# Patient Record
Sex: Male | Born: 1935 | Race: White | Hispanic: No | Marital: Married | Smoking: Former smoker
Health system: Southern US, Community
[De-identification: ages and names within clinical notes are randomized; demographics above are authoritative.]

## PROBLEM LIST (undated history)

## (undated) DIAGNOSIS — J45909 Unspecified asthma, uncomplicated: Secondary | ICD-10-CM

## (undated) DIAGNOSIS — I5042 Chronic combined systolic (congestive) and diastolic (congestive) heart failure: Secondary | ICD-10-CM

## (undated) DIAGNOSIS — E785 Hyperlipidemia, unspecified: Secondary | ICD-10-CM

## (undated) DIAGNOSIS — I2510 Atherosclerotic heart disease of native coronary artery without angina pectoris: Secondary | ICD-10-CM

## (undated) DIAGNOSIS — I10 Essential (primary) hypertension: Secondary | ICD-10-CM

## (undated) DIAGNOSIS — J449 Chronic obstructive pulmonary disease, unspecified: Secondary | ICD-10-CM

## (undated) DIAGNOSIS — M1990 Unspecified osteoarthritis, unspecified site: Secondary | ICD-10-CM

## (undated) DIAGNOSIS — I499 Cardiac arrhythmia, unspecified: Secondary | ICD-10-CM

## (undated) DIAGNOSIS — I429 Cardiomyopathy, unspecified: Secondary | ICD-10-CM

## (undated) DIAGNOSIS — D494 Neoplasm of unspecified behavior of bladder: Secondary | ICD-10-CM

## (undated) DIAGNOSIS — N189 Chronic kidney disease, unspecified: Secondary | ICD-10-CM

## (undated) DIAGNOSIS — R0602 Shortness of breath: Secondary | ICD-10-CM

## (undated) DIAGNOSIS — R351 Nocturia: Secondary | ICD-10-CM

---

## 1998-07-16 HISTORY — PX: HAND SURGERY: SHX662

## 2013-08-17 NOTE — Progress Notes (Signed)
Dr. Wynelle Link could you please enter orders in [facility] as patient has pre-op appointment on 08/24/2013 at 8 am! Thank you!

## 2013-08-18 ENCOUNTER — Other Ambulatory Visit: Payer: Self-pay | Admitting: Orthopedic Surgery

## 2013-08-19 ENCOUNTER — Other Ambulatory Visit: Payer: Self-pay | Admitting: Surgical

## 2013-08-19 ENCOUNTER — Encounter (HOSPITAL_COMMUNITY): Payer: Self-pay | Admitting: Pharmacy Technician

## 2013-08-19 NOTE — H&P (Signed)
TOTAL KNEE ADMISSION H&P  Patient is being admitted for left total knee arthroplasty.  Subjective:  Chief Complaint:left knee pain.  HPI: Jerry Chase, 79 y.o. male, has a history of pain and functional disability in the left knee due to arthritis and has failed non-surgical conservative treatments for greater than 12 weeks to includeNSAID's and/or analgesics, corticosteriod injections, viscosupplementation injections and activity modification.  Onset of symptoms was gradual, starting >10 years ago with gradually worsening course since that time. The patient noted no past surgery on the left knee(s).  Patient currently rates pain in the left knee(s) at 7 out of 10 with activity. Patient has night pain, worsening of pain with activity and weight bearing, pain that interferes with activities of daily living, pain with passive range of motion, crepitus and joint swelling.  Patient has evidence of periarticular osteophytes and joint space narrowing by imaging studies.  There is no active infection.  Past Medical History Asthma Shingles Bronchitis COPD HTN Hypercholesteremia  NKDA  Medications Advair Diskus (250-50MCG/DOSE Aero Pow Br Act, Inhalation) Active. Benzonatate (100MG  Capsule, Oral) Active. Combivent (18-103MCG/ACT Aerosol, Inhalation) Active. Pravastatin Sodium (80MG  Tablet, Oral) Active. Valsartan-Hydrochlorothiazide (160-25MG  Tablet, Oral) Active. Verapamil HCl ER (240MG  Tablet ER, Oral) Active. tramadol (prn) Active. History  Substance Use Topics  . Smoking status: Smokes 1/2 pack per day for 60 years  . Smokeless tobacco: None  . Alcohol Use: None    Family History Father deceased age 43 due to MVA; history of COPD Mother deceased age 68 due to cancer; history of heart disease  Review of Systems  Constitutional: Negative.   HENT: Negative.   Eyes: Negative.   Respiratory: Positive for cough, shortness of breath and wheezing. Negative for hemoptysis and sputum  production.        SOB on exertion  Cardiovascular: Negative.   Gastrointestinal: Negative.   Genitourinary: Negative.   Musculoskeletal: Positive for back pain and joint pain. Negative for falls, myalgias and neck pain.       Left knee pain  Skin: Negative.   Neurological: Negative.   Endo/Heme/Allergies: Negative.   Psychiatric/Behavioral: Negative.     Objective:  Physical Exam  Constitutional: He is oriented to person, place, and time. He appears well-developed and well-nourished. No distress.  HENT:  Head: Normocephalic and atraumatic.  Right Ear: External ear normal.  Left Ear: External ear normal.  Nose: Nose normal.  Mouth/Throat: Oropharynx is clear and moist.  Eyes: Conjunctivae and EOM are normal.  Neck: Normal range of motion. Neck supple.  Cardiovascular: Normal rate, regular rhythm, normal heart sounds and intact distal pulses.   No murmur heard. Respiratory: Effort normal. No respiratory distress. He has wheezes.  GI: Soft. Bowel sounds are normal. He exhibits no distension. There is no tenderness.  Musculoskeletal:       Right hip: Normal.       Left hip: Normal.       Right knee: Normal.       Left knee: He exhibits decreased range of motion and swelling. He exhibits no effusion and no erythema. Tenderness found. Medial joint line and lateral joint line tenderness noted.       Right lower leg: He exhibits no tenderness and no swelling.       Left lower leg: He exhibits no tenderness and no swelling.  Evaluation of his left knee shows no effusion. He has slight valgus. He has marked crepitus on range of motion. He is tender lateral greater than medial with no instability.  Neurological: He is alert and oriented to person, place, and time. He has normal strength and normal reflexes. No sensory deficit.  Skin: No rash noted. He is not diaphoretic. No erythema.  Psychiatric: His behavior is normal.    Vitals Weight: 195 lb Height: 70 in Body Surface  Area: 2.09 m Body Mass Index: 27.98 kg/m Pulse: 88 (Regular) BP: 132/76 (Sitting, Left Arm, Standard)  Imaging Review Plain radiographs demonstrate severe degenerative joint disease of the left knee(s). The overall alignment ismild valgus. The bone quality appears to be good for age and reported activity level.  Assessment/Plan:  End stage arthritis, left knee   The patient history, physical examination, clinical judgment of the provider and imaging studies are consistent with end stage degenerative joint disease of the left knee(s) and total knee arthroplasty is deemed medically necessary. The treatment options including medical management, injection therapy arthroscopy and arthroplasty were discussed at length. The risks and benefits of total knee arthroplasty were presented and reviewed. The risks due to aseptic loosening, infection, stiffness, patella tracking problems, thromboembolic complications and other imponderables were discussed. The patient acknowledged the explanation, agreed to proceed with the plan and consent was signed. Patient is being admitted for inpatient treatment for surgery, pain control, PT, OT, prophylactic antibiotics, VTE prophylaxis, progressive ambulation and ADL's and discharge planning. The patient is planning to be discharged home with home health services     [place], [place]

## 2013-08-21 ENCOUNTER — Other Ambulatory Visit (HOSPITAL_COMMUNITY): Payer: Self-pay | Admitting: *Deleted

## 2013-08-21 NOTE — Patient Instructions (Addendum)
Jerry Chase  08/21/2013                           YOUR PROCEDURE IS SCHEDULED ON: 08/31/13               PLEASE REPORT TO SHORT STAY CENTER AT : 9:45 AM               CALL THIS NUMBER IF ANY PROBLEMS THE DAY OF SURGERY :               [phone]                                REMEMBER:   Do not eat food or drink liquids AFTER MIDNIGHT   May have clear liquids UNTIL 6 HOURS BEFORE SURGERY (6:45 AM)     Take these medicines the morning of surgery with A SIP OF WATER:  VERAPAMIL / USE INHALERS   Do not wear jewelry, make-up   Do not wear lotions, powders, or perfumes.   Do not shave legs or underarms 12 hrs. before surgery (men may shave face)  Do not bring valuables to the hospital.  Contacts, dentures or bridgework may not be worn into surgery.  Leave suitcase in the car. After surgery it may be brought to your room.  For patients admitted to the hospital more than one night, checkout time is 11:00 AM                                                       The day of discharge.   Patients discharged the day of surgery will not be allowed to drive home.              If going home same day of surgery, must have someone stay with you first              24 hrs at home and arrange for some one to drive you home from hospital.    Special Instructions:   Please read over the following fact sheets that you were given:               1. [place]               2. INCENTIVE SPIROMETER              3. MRSA INFORMATION                                                X_____________________________________________________________________        Failure to follow these instructions may result in cancellation of your surgery

## 2013-08-24 ENCOUNTER — Encounter (HOSPITAL_COMMUNITY)
Admission: RE | Admit: 2013-08-24 | Discharge: 2013-08-24 | Disposition: A | Discharge status: Home / Self Care | Payer: Medicare Other | Source: Ambulatory Visit | Attending: Orthopedic Surgery | Admitting: Orthopedic Surgery

## 2013-08-24 ENCOUNTER — Other Ambulatory Visit: Payer: Self-pay

## 2013-08-24 ENCOUNTER — Encounter (HOSPITAL_COMMUNITY): Payer: Self-pay

## 2013-08-24 ENCOUNTER — Encounter (INDEPENDENT_AMBULATORY_CARE_PROVIDER_SITE_OTHER): Payer: Self-pay

## 2013-08-24 ENCOUNTER — Ambulatory Visit (HOSPITAL_COMMUNITY)
Admission: RE | Admit: 2013-08-24 | Discharge: 2013-08-24 | Disposition: A | Discharge status: Home / Self Care | Payer: Medicare Other | Source: Ambulatory Visit | Attending: Orthopedic Surgery | Admitting: Orthopedic Surgery

## 2013-08-24 DIAGNOSIS — I4891 Unspecified atrial fibrillation: Secondary | ICD-10-CM | POA: Insufficient documentation

## 2013-08-24 DIAGNOSIS — R9431 Abnormal electrocardiogram [ECG] [EKG]: Secondary | ICD-10-CM | POA: Insufficient documentation

## 2013-08-24 DIAGNOSIS — Z01812 Encounter for preprocedural laboratory examination: Secondary | ICD-10-CM | POA: Insufficient documentation

## 2013-08-24 DIAGNOSIS — I4510 Unspecified right bundle-branch block: Secondary | ICD-10-CM | POA: Insufficient documentation

## 2013-08-24 DIAGNOSIS — Z01810 Encounter for preprocedural cardiovascular examination: Secondary | ICD-10-CM | POA: Insufficient documentation

## 2013-08-24 DIAGNOSIS — Z01818 Encounter for other preprocedural examination: Secondary | ICD-10-CM | POA: Insufficient documentation

## 2013-08-24 DIAGNOSIS — I4949 Other premature depolarization: Secondary | ICD-10-CM | POA: Insufficient documentation

## 2013-08-24 HISTORY — DX: Unspecified asthma, uncomplicated: J45.909

## 2013-08-24 HISTORY — DX: Hyperlipidemia, unspecified: E78.5

## 2013-08-24 HISTORY — DX: Essential (primary) hypertension: I10

## 2013-08-24 HISTORY — DX: Unspecified osteoarthritis, unspecified site: M19.90

## 2013-08-24 LAB — COMPREHENSIVE METABOLIC PANEL
ALK PHOS: 74 U/L (ref 39–117)
ALT: 29 U/L (ref 0–53)
AST: 15 U/L (ref 0–37)
Albumin: 3 g/dL — ABNORMAL LOW (ref 3.5–5.2)
BUN: 29 mg/dL — AB (ref 6–23)
CALCIUM: 8.5 mg/dL (ref 8.4–10.5)
CO2: 28 mEq/L (ref 19–32)
Chloride: 102 mEq/L (ref 96–112)
Creatinine, Ser: 1.06 mg/dL (ref 0.50–1.35)
GFR calc Af Amer: 76 mL/min — ABNORMAL LOW (ref >90)
GFR calc non Af Amer: 66 mL/min — ABNORMAL LOW (ref >90)
Glucose, Bld: 101 mg/dL — ABNORMAL HIGH (ref 70–99)
Potassium: 4.5 mEq/L (ref 3.7–5.3)
SODIUM: 140 meq/L (ref 137–147)
TOTAL PROTEIN: 6 g/dL (ref 6.0–8.3)
Total Bilirubin: 0.4 mg/dL (ref 0.3–1.2)

## 2013-08-24 LAB — URINALYSIS, ROUTINE W REFLEX MICROSCOPIC
Bilirubin Urine: NEGATIVE
Glucose, UA: NEGATIVE mg/dL
Hgb urine dipstick: NEGATIVE
KETONES UR: NEGATIVE mg/dL
NITRITE: NEGATIVE
PROTEIN: NEGATIVE mg/dL
Specific Gravity, Urine: 1.021 (ref 1.005–1.030)
Urobilinogen, UA: 0.2 mg/dL (ref 0.0–1.0)
pH: 5.5 (ref 5.0–8.0)

## 2013-08-24 LAB — URINE MICROSCOPIC-ADD ON

## 2013-08-24 LAB — CBC
HEMATOCRIT: 41.2 % (ref 39.0–52.0)
Hemoglobin: 13.7 g/dL (ref 13.0–17.0)
MCH: 30.4 pg (ref 26.0–34.0)
MCHC: 33.3 g/dL (ref 30.0–36.0)
MCV: 91.4 fL (ref 78.0–100.0)
Platelets: 353 10*3/uL (ref 150–400)
RBC: 4.51 MIL/uL (ref 4.22–5.81)
RDW: 13.8 % (ref 11.5–15.5)
WBC: 9.9 10*3/uL (ref 4.0–10.5)

## 2013-08-24 LAB — PROTIME-INR
INR: 1.03 (ref 0.00–1.49)
Prothrombin Time: 13.3 seconds (ref 11.6–15.2)

## 2013-08-24 LAB — APTT: APTT: 26 s (ref 24–37)

## 2013-08-24 LAB — ABO/RH: ABO/RH(D): A POS

## 2013-08-24 LAB — SURGICAL PCR SCREEN
MRSA, PCR: NEGATIVE
Staphylococcus aureus: NEGATIVE

## 2013-08-24 NOTE — Progress Notes (Signed)
EKG reviewed by Dr. Winfred Leeds - recommended pt be seen by cardiologist prior to surgery - Pt informed and Arlee Muslim notified per phone. Office phone out of order - unable to reach [employer] .  UA faxed to Dr. Wynelle Link

## 2013-08-24 NOTE — Progress Notes (Signed)
08/24/13 0818  OBSTRUCTIVE SLEEP APNEA  Have you ever been diagnosed with sleep apnea through a sleep study? No  Do you snore loudly (loud enough to be heard through closed doors)?  1  Do you often feel tired, fatigued, or sleepy during the daytime? 0  Has anyone observed you stop breathing during your sleep? 0  Do you have, or are you being treated for high blood pressure? 1  BMI more than 35 kg/m2? 0  Age over 77 years old? 1  Neck circumference greater than 40 cm/18 inches? 0  Gender: 1  Obstructive Sleep Apnea Score 4  Score 4 or greater  Results sent to PCP

## 2013-08-28 NOTE — Progress Notes (Signed)
Spoke with Jerry Chase concerning cardiac clearance- pt is seeing cardiologist today - awaiting disposition .

## 2013-08-31 ENCOUNTER — Inpatient Hospital Stay (HOSPITAL_COMMUNITY): Admission: RE | Admit: 2013-08-31 | Payer: Medicare Other | Source: Ambulatory Visit | Admitting: Orthopedic Surgery

## 2013-08-31 ENCOUNTER — Encounter (HOSPITAL_COMMUNITY): Admission: RE | Payer: Self-pay | Source: Ambulatory Visit

## 2013-08-31 LAB — TYPE AND SCREEN
ABO/RH(D): A POS
ANTIBODY SCREEN: NEGATIVE

## 2013-08-31 SURGERY — ARTHROPLASTY, KNEE, TOTAL
Anesthesia: Choice | Site: Knee | Laterality: Left

## 2013-09-28 MED ORDER — ACETAMINOPHEN 500 MG PO TABS
1000.0000 mg | ORAL_TABLET | Freq: Once | ORAL | Status: DC
  Filled 2013-09-28: qty 2

## 2013-09-28 MED ORDER — CEFAZOLIN SODIUM-DEXTROSE 2-3 GM-% IV SOLR
2.0000 g | INTRAVENOUS | Status: DC
  Filled 2013-09-28: qty 50

## 2013-09-28 MED ORDER — TRANEXAMIC ACID 100 MG/ML IV SOLN
1000.0000 mg | INTRAVENOUS | Status: DC
  Filled 2013-09-28: qty 10

## 2013-09-28 MED ORDER — DEXAMETHASONE SODIUM PHOSPHATE 10 MG/ML IJ SOLN
10.0000 mg | Freq: Once | INTRAMUSCULAR | Status: DC
  Filled 2013-09-28: qty 1

## 2013-09-28 MED ORDER — BUPIVACAINE LIPOSOME 1.3 % IJ SUSP
20.0000 mL | Freq: Once | INTRAMUSCULAR | Status: DC
  Filled 2013-09-28: qty 20

## 2013-09-29 ENCOUNTER — Encounter (HOSPITAL_COMMUNITY): Payer: Self-pay | Admitting: Anesthesiology

## 2013-09-29 ENCOUNTER — Ambulatory Visit (HOSPITAL_COMMUNITY)
Admission: RE | Admit: 2013-09-29 | Discharge: 2013-09-29 | Disposition: A | Discharge status: Home / Self Care | Payer: Medicare Other | Source: Ambulatory Visit | Attending: Cardiology | Admitting: Cardiology

## 2013-09-29 ENCOUNTER — Encounter (HOSPITAL_COMMUNITY)
Admission: RE | Disposition: A | Discharge status: Home / Self Care | Payer: Self-pay | Source: Ambulatory Visit | Attending: Cardiology

## 2013-09-29 ENCOUNTER — Encounter (HOSPITAL_COMMUNITY): Payer: Medicare Other | Admitting: Anesthesiology

## 2013-09-29 ENCOUNTER — Ambulatory Visit (HOSPITAL_COMMUNITY): Payer: Medicare Other | Admitting: Anesthesiology

## 2013-09-29 DIAGNOSIS — J4489 Other specified chronic obstructive pulmonary disease: Secondary | ICD-10-CM | POA: Insufficient documentation

## 2013-09-29 DIAGNOSIS — I10 Essential (primary) hypertension: Secondary | ICD-10-CM | POA: Insufficient documentation

## 2013-09-29 DIAGNOSIS — J449 Chronic obstructive pulmonary disease, unspecified: Secondary | ICD-10-CM | POA: Insufficient documentation

## 2013-09-29 DIAGNOSIS — I4891 Unspecified atrial fibrillation: Secondary | ICD-10-CM | POA: Insufficient documentation

## 2013-09-29 DIAGNOSIS — F17200 Nicotine dependence, unspecified, uncomplicated: Secondary | ICD-10-CM | POA: Insufficient documentation

## 2013-09-29 HISTORY — PX: CARDIOVERSION: SHX1299

## 2013-09-29 LAB — BASIC METABOLIC PANEL
BUN: 24 mg/dL — ABNORMAL HIGH (ref 6–23)
CO2: 25 mEq/L (ref 19–32)
CREATININE: 0.89 mg/dL (ref 0.50–1.35)
Calcium: 9 mg/dL (ref 8.4–10.5)
Chloride: 104 mEq/L (ref 96–112)
GFR calc non Af Amer: 80 mL/min — ABNORMAL LOW (ref >90)
Glucose, Bld: 101 mg/dL — ABNORMAL HIGH (ref 70–99)
Potassium: 4.4 mEq/L (ref 3.7–5.3)
Sodium: 142 mEq/L (ref 137–147)

## 2013-09-29 LAB — PROTIME-INR
INR: 1.74 — AB (ref 0.00–1.49)
PROTHROMBIN TIME: 19.8 s — AB (ref 11.6–15.2)

## 2013-09-29 SURGERY — CARDIOVERSION
Anesthesia: General

## 2013-09-29 MED ORDER — SODIUM CHLORIDE 0.9 % IV SOLN
INTRAVENOUS | Status: DC | PRN
  Administered 2013-09-29: 10:00:00 via INTRAVENOUS

## 2013-09-29 MED ORDER — PROPOFOL 10 MG/ML IV BOLUS
INTRAVENOUS | Status: DC | PRN
  Administered 2013-09-29: 30 mg via INTRAVENOUS
  Administered 2013-09-29: 60 mg via INTRAVENOUS

## 2013-09-29 MED ORDER — LIDOCAINE HCL (CARDIAC) 20 MG/ML IV SOLN
INTRAVENOUS | Status: DC | PRN
  Administered 2013-09-29: 40 mg via INTRAVENOUS

## 2013-09-29 NOTE — Transfer of Care (Signed)
Immediate Anesthesia Transfer of Care Note  Patient: Jerry Chase  Procedure(s) Performed: Procedure(s) with comments: CARDIOVERSION (N/A) - 11:35  Lido 20mg , IV, Propofol 60mg ,IV prior to 11:36 synched cardioversion @ 120 joules from  afib, unsuccessful,   11:37 repeated @ 200 joules resulting in unsustained SR with 1 degree heart block, returned to A fib, 11:38 Propofol 30mg ,IV given ...200 joules repeated without successful conversion to SR,...now in A flutter but HR is improved @ 57 - 62 and perfusion/pulse quality is much improved.  Patient Location: PACU and Endoscopy Unit  Anesthesia Type:General  Level of Consciousness: awake and patient cooperative  Airway & Oxygen Therapy: Patient Spontanous Breathing and Patient connected to nasal cannula oxygen  Post-op Assessment: Report given to PACU RN, Post -op Vital signs reviewed and stable and remains in at fib/flutter with PVCs  Post vital signs: Reviewed and stable  Complications: No apparent anesthesia complications

## 2013-09-29 NOTE — Discharge Instructions (Signed)
Electrical Cardioversion, Care After Refer to this sheet in the next few weeks. These instructions provide you with information on caring for yourself after your procedure. Your health care provider may also give you more specific instructions. Your treatment has been planned according to current medical practices, but problems sometimes occur. Call your health care provider if you have any problems or questions after your procedure. WHAT TO EXPECT AFTER THE PROCEDURE After your procedure, it is typical to have the following sensations:  Some redness on the skin where the shocks were delivered. If this is tender, a sunburn lotion or hydrocortisone cream may help.  Possible return of an abnormal heart rhythm within hours or days after the procedure. HOME CARE INSTRUCTIONS  Only take medicine as directed by your health care provider. Be sure you understand how and when to take your medicine.  Learn how to feel your pulse and check it often.  Limit your activity for 48 hours after the procedure or as directed.  Avoid or minimize caffeine and other stimulants as directed. SEEK MEDICAL CARE IF:  You feel like your heart is beating too fast or your pulse is not regular.  You have any questions about your medicines.  You have bleeding that will not stop. SEEK IMMEDIATE MEDICAL CARE IF:  You are dizzy or feel faint.  It is hard to breathe or you feel short of breath.  There is a change in discomfort in your chest.  Your speech is slurred or you have trouble moving an arm or leg on one side of your body.  You get a serious muscle cramp that does not go away.  Your fingers or toes turn cold or blue. MAKE SURE YOU:   Understand these instructions.   Will watch your condition.   Will get help right away if you are not doing well or get worse. Document Released: 04/22/2013 Document Reviewed: 01/14/2013 [facility] Patient Information 2014 [place], [place].

## 2013-09-29 NOTE — H&P (Signed)
  Please see office visit notes for complete details of HPI.  

## 2013-09-29 NOTE — Anesthesia Postprocedure Evaluation (Signed)
  Anesthesia Post-op Note  Patient: Jerry Chase  Procedure(s) Performed: Procedure(s) with comments: CARDIOVERSION (N/A) - 11:35  Lido 20mg , IV, Propofol 60mg ,IV prior to 11:36 synched cardioversion @ 120 joules from  afib, unsuccessful,   11:38 repeated @ 200 joules resulting in unsustained SR with 1 degree heart block, returned to A fib, 11:38 Propofol 30mg ,IV given ...200 joules repeated without successful conversion to SR,...now in A flutter but HR is improved @ 57 - 62 and perfusion/pulse quality is much improved.  Patient Location: PACU and Endoscopy Unit  Anesthesia Type:General  Level of Consciousness: awake  Airway and Oxygen Therapy: Patient Spontanous Breathing  Post-op Pain: none  Post-op Assessment: Post-op Vital signs reviewed, Patient's Cardiovascular Status Stable, Respiratory Function Stable, Patent Airway, No signs of Nausea or vomiting and Pain level controlled  Post-op Vital Signs: Reviewed and stable  Complications: No apparent anesthesia complications

## 2013-09-29 NOTE — Anesthesia Preprocedure Evaluation (Addendum)
Anesthesia Evaluation  Patient identified by MRN, date of birth, ID band Patient awake    Reviewed: Allergy & Precautions, H&P , NPO status , Patient's Chart, lab work & pertinent test results, reviewed documented beta blocker date and time   Airway Mallampati: II TM Distance: >3 FB Neck ROM: Full    Dental  (+) Upper Dentures, Lower Dentures, Dental Advisory Given   Pulmonary asthma , COPDCurrent Smoker,  + rhonchi         Cardiovascular hypertension, Pt. on medications + dysrhythmias Atrial Fibrillation Rate:Normal     Neuro/Psych    GI/Hepatic   Endo/Other    Renal/GU      Musculoskeletal   Abdominal   Peds  Hematology   Anesthesia Other Findings   Reproductive/Obstetrics                         Anesthesia Physical Anesthesia Plan  ASA: III  Anesthesia Plan: General   Post-op Pain Management:    Induction: Intravenous  Airway Management Planned: Mask  Additional Equipment:   Intra-op Plan:   Post-operative Plan:   Informed Consent: I have reviewed the patients History and Physical, chart, labs and discussed the procedure including the risks, benefits and alternatives for the proposed anesthesia with the patient or authorized representative who has indicated his/her understanding and acceptance.     Plan Discussed with: CRNA and Surgeon  Anesthesia Plan Comments:         Anesthesia Quick Evaluation

## 2013-09-29 NOTE — Interval H&P Note (Signed)
History and Physical Interval Note:  09/29/2013 11:33 AM  Jerry Chase  has presented today for surgery, with the diagnosis of AFIB,CARDIOMYOPATHY  The various methods of treatment have been discussed with the patient and family. After consideration of risks, benefits and other options for treatment, the patient has consented to  Procedure(s): CARDIOVERSION (N/A) as a surgical intervention .  The patient's history has been reviewed, patient examined, no change in status, stable for surgery.  I have reviewed the patient's chart and labs.  Questions were answered to the patient's satisfaction.     Laverda Page

## 2013-09-29 NOTE — CV Procedure (Signed)
Direct current cardioversion:  Indication symptomatic A. Fibrillation.  Procedure: Using 90 mg of IV Propofol and 20 IV Lidocaine (for reducing venous pain) for achieving deep sedation, synchronized direct current cardioversion performed. Patient was delivered with 120, 150 and then 200 Joules of electricity, patient can he converted to sinus rhythm, however reverted back to atrial fibrillation. Overall unsuccessful attempt at maintaining sinus rhythm after cardioversion. Patient tolerated the procedure well. No immediate complication noted.

## 2013-09-29 NOTE — Preoperative (Signed)
Beta Blockers   Reason not to administer Beta Blockers:Not Applicable 

## 2013-09-30 ENCOUNTER — Encounter (HOSPITAL_COMMUNITY): Payer: Self-pay | Admitting: Cardiology

## 2013-11-23 ENCOUNTER — Encounter (HOSPITAL_COMMUNITY): Payer: Self-pay | Admitting: Pharmacy Technician

## 2013-11-24 ENCOUNTER — Encounter (HOSPITAL_COMMUNITY): Payer: Self-pay | Admitting: *Deleted

## 2013-11-24 ENCOUNTER — Ambulatory Visit (HOSPITAL_COMMUNITY): Payer: Medicare Other | Admitting: Anesthesiology

## 2013-11-24 ENCOUNTER — Encounter (HOSPITAL_COMMUNITY): Payer: Medicare Other | Admitting: Anesthesiology

## 2013-11-24 ENCOUNTER — Ambulatory Visit (HOSPITAL_COMMUNITY)
Admission: RE | Admit: 2013-11-24 | Discharge: 2013-11-24 | Disposition: A | Discharge status: Home / Self Care | Payer: Medicare Other | Source: Ambulatory Visit | Attending: Cardiology | Admitting: Cardiology

## 2013-11-24 ENCOUNTER — Encounter (HOSPITAL_COMMUNITY)
Admission: RE | Disposition: A | Discharge status: Home / Self Care | Payer: Self-pay | Source: Ambulatory Visit | Attending: Cardiology

## 2013-11-24 DIAGNOSIS — I10 Essential (primary) hypertension: Secondary | ICD-10-CM | POA: Insufficient documentation

## 2013-11-24 DIAGNOSIS — I440 Atrioventricular block, first degree: Secondary | ICD-10-CM | POA: Insufficient documentation

## 2013-11-24 DIAGNOSIS — I498 Other specified cardiac arrhythmias: Secondary | ICD-10-CM | POA: Insufficient documentation

## 2013-11-24 DIAGNOSIS — J4489 Other specified chronic obstructive pulmonary disease: Secondary | ICD-10-CM | POA: Insufficient documentation

## 2013-11-24 DIAGNOSIS — Z87891 Personal history of nicotine dependence: Secondary | ICD-10-CM | POA: Insufficient documentation

## 2013-11-24 DIAGNOSIS — I4891 Unspecified atrial fibrillation: Secondary | ICD-10-CM | POA: Insufficient documentation

## 2013-11-24 DIAGNOSIS — J449 Chronic obstructive pulmonary disease, unspecified: Secondary | ICD-10-CM | POA: Insufficient documentation

## 2013-11-24 HISTORY — PX: CARDIOVERSION: SHX1299

## 2013-11-24 SURGERY — CARDIOVERSION
Anesthesia: Monitor Anesthesia Care

## 2013-11-24 MED ORDER — PROPOFOL 10 MG/ML IV BOLUS
INTRAVENOUS | Status: DC | PRN
  Administered 2013-11-24: 50 mg via INTRAVENOUS

## 2013-11-24 MED ORDER — LIDOCAINE HCL (CARDIAC) 20 MG/ML IV SOLN
INTRAVENOUS | Status: DC | PRN
  Administered 2013-11-24: 20 mg via INTRAVENOUS

## 2013-11-24 MED ORDER — SODIUM CHLORIDE 0.9 % IV SOLN
INTRAVENOUS | Status: DC | PRN
  Administered 2013-11-24: 12:00:00 via INTRAVENOUS

## 2013-11-24 MED ORDER — METOPROLOL TARTRATE 50 MG PO TABS: 25.0000 mg | ORAL_TABLET | Freq: Two times a day (BID) | ORAL | Status: DC

## 2013-11-24 MED ORDER — SODIUM CHLORIDE 0.9 % IV SOLN
INTRAVENOUS | Status: DC
Start: 2013-11-24 — End: 2013-11-24
  Administered 2013-11-24: 10:00:00 via INTRAVENOUS

## 2013-11-24 NOTE — Interval H&P Note (Signed)
History and Physical Interval Note:  11/24/2013 11:34 AM  Jerry Chase  has presented today for surgery, with the diagnosis of A FIB   The various methods of treatment have been discussed with the patient and family. After consideration of risks, benefits and other options for treatment, the patient has consented to  Procedure(s): CARDIOVERSION (N/A) as a surgical intervention .  The patient's history has been reviewed, patient examined, no change in status, stable for surgery.  I have reviewed the patient's chart and labs.  Questions were answered to the patient's satisfaction.     Laverda Page

## 2013-11-24 NOTE — Progress Notes (Signed)
EKG CRITICAL VALUE     12 lead EKG performed.  Critical value noted.  Clemmie Krill, RN notified.   Vanessa Barbara, CCT 11/24/2013 12:11 PM

## 2013-11-24 NOTE — Discharge Instructions (Signed)
Electrical Cardioversion °Electrical cardioversion is the delivery of a jolt of electricity to change the rhythm of the heart. Sticky patches or metal paddles are placed on the chest to deliver the electricity from a device. This is done to restore a normal rhythm. A rhythm that is too fast or not regular keeps the heart from pumping well. °Electrical cardioversion is done in an emergency if:  °· There is low or no blood pressure as a result of the heart rhythm.   °· Normal rhythm must be restored as fast as possible to protect the brain and heart from further damage.   °· It may save a life. °Cardioversion may be done for heart rhythms that are not immediately life-threatening, such as atrial fibrillation or flutter, in which:  °· The heart is beating too fast or is not regular.   °· Medicine to change the rhythm has not worked.   °· It is safe to wait in order to allow time for preparation. °· Symptoms of the abnormal rhythm are bothersome. °· The risk of stroke and other serious complications can be reduced. °LET YOUR CAREGIVER KNOW ABOUT:  °· All medicines you are taking, including vitamins, herbs, eye drops, creams, and over-the-counter medicines.   °· Previous problems you or members of your family have had with the use of anesthetics.   °· Any blood disorders you have.   °· Previous surgeries you have had.   °· Medical conditions you have. °RISKS AND COMPLICATIONS  °Generally, this is a safe procedure. However, as with any procedure, complications can occur. Possible complications include:  °· Breathing problems related to the anesthetic used. °· Cardiac arrest This risk is rare. °· A blood clot that breaks free and travels to other parts of your body. This could cause a stroke or other problems. The risk of this is lowered by use of blood thinning medicine (anticoagulant) prior to the procedure. °BEFORE THE PROCEDURE  °· You may have tests to detect blood clots in your heart and evaluate heart  function.  °· You may start taking anticoagulants so your blood does not clot as easily.   °· Medicines may be given to help stabilize your heart rate and rhythm. °PROCEDURE °· You will be given medicine through an IV tube to reduce discomfort and make you sleepy (sedative).   °· An electrical shock will be delivered. °AFTER THE PROCEDURE °Your heart rhythm will be watched to make sure it does not change. You may be able to go home within a few hours.  °Document Released: 06/22/2002 Document Revised: 04/22/2013 Document Reviewed: 01/14/2013 °[employer]® Patient Information ©2014 [employer]. ° °

## 2013-11-24 NOTE — CV Procedure (Signed)
Direct current cardioversion:  Indication symptomatic A. Fibrillation.  Procedure: Using 60 mg of IV Propofol and 20 IV Lidocaine (for reducing venous pain) for achieving deep sedation, synchronized direct current cardioversion performed. Patient was delivered with 120 Joules of electricity X 1 with success to SR with 1st degree AV block. Patient tolerated the procedure well. No immediate complication noted.

## 2013-11-24 NOTE — Anesthesia Postprocedure Evaluation (Signed)
  Anesthesia Post-op Note  Patient: Jerry Chase  Procedure(s) Performed: Procedure(s): CARDIOVERSION (N/A)  Patient Location: Endoscopy Unit  Anesthesia Type:General  Level of Consciousness: awake, alert , oriented and patient cooperative  Airway and Oxygen Therapy: Patient Spontanous Breathing and Patient connected to nasal cannula oxygen  Post-op Pain: none  Post-op Assessment: Post-op Vital signs reviewed, Patient's Cardiovascular Status Stable, Respiratory Function Stable, Patent Airway, No signs of Nausea or vomiting and Pain level controlled  Post-op Vital Signs: Reviewed and stable  Last Vitals:  Filed Vitals:   11/24/13 1220  BP: 124/67  Pulse: 59  Temp:   Resp: 22    Complications: No apparent anesthesia complications

## 2013-11-24 NOTE — H&P (Signed)
  Please see office visit notes for complete details of HPI.  

## 2013-11-24 NOTE — Anesthesia Preprocedure Evaluation (Addendum)
Anesthesia Evaluation  Patient identified by MRN, date of birth, ID band Patient awake    Reviewed: Allergy & Precautions, H&P , NPO status , Patient's Chart, lab work & pertinent test results, reviewed documented beta blocker date and time   History of Anesthesia Complications Negative for: history of anesthetic complications  Airway Mallampati: II TM Distance: >3 FB Neck ROM: Full    Dental  (+) Upper Dentures, Lower Dentures   Pulmonary asthma , COPD COPD inhaler, former smoker (recently quit),  breath sounds clear to auscultation        Cardiovascular hypertension, Pt. on medications and Pt. on home beta blockers + dysrhythmias (normal LVF as per Dr. Einar Gip) Atrial Fibrillation Rhythm:Irregular Rate:Bradycardia     Neuro/Psych negative neurological ROS     GI/Hepatic negative GI ROS, Neg liver ROS,   Endo/Other  negative endocrine ROS  Renal/GU negative Renal ROS     Musculoskeletal   Abdominal   Peds  Hematology  (+) Blood dyscrasia (xarelto), ,   Anesthesia Other Findings   Reproductive/Obstetrics                          Anesthesia Physical Anesthesia Plan  ASA: III  Anesthesia Plan: General   Post-op Pain Management:    Induction: Intravenous  Airway Management Planned: Mask  Additional Equipment:   Intra-op Plan:   Post-operative Plan:   Informed Consent: I have reviewed the patients History and Physical, chart, labs and discussed the procedure including the risks, benefits and alternatives for the proposed anesthesia with the patient or authorized representative who has indicated his/her understanding and acceptance.     Plan Discussed with: Surgeon and CRNA  Anesthesia Plan Comments: (Plan routine monitors, GA for cardioversion)        Anesthesia Quick Evaluation

## 2013-11-24 NOTE — Transfer of Care (Signed)
Immediate Anesthesia Transfer of Care Note  Patient: Jerry Chase  Procedure(s) Performed: Procedure(s): CARDIOVERSION (N/A)  Patient Location: PACU  Anesthesia Type:General  Level of Consciousness: awake, alert  and oriented  Airway & Oxygen Therapy: Patient Spontanous Breathing and Patient connected to nasal cannula oxygen  Post-op Assessment: Report given to PACU RN and Post -op Vital signs reviewed and stable  Post vital signs: Reviewed and stable  Complications: No apparent anesthesia complications

## 2013-11-25 ENCOUNTER — Encounter (HOSPITAL_COMMUNITY): Payer: Self-pay | Admitting: Cardiology

## 2013-11-30 NOTE — Progress Notes (Signed)
Arlee Muslim, PA  - Please enter preop orders in Epic for Jerry Chase - surg date 6/1.  Thanks

## 2013-12-02 ENCOUNTER — Encounter (HOSPITAL_COMMUNITY): Payer: Self-pay | Admitting: Pharmacy Technician

## 2013-12-04 NOTE — Progress Notes (Signed)
Arlee Muslim, PA - Please enter preop orders in Epic for Jerry Chase  -  Surgery date 6/1 and he is coming to [facility] on Wed 5/27 for preop.  Thanks.

## 2013-12-07 ENCOUNTER — Other Ambulatory Visit: Payer: Self-pay | Admitting: Orthopedic Surgery

## 2013-12-08 NOTE — Patient Instructions (Signed)
Jerry Chase  12/08/2013   Your procedure is scheduled on:  12/14/2013.  0820am-0910am  Report to [facility].  Follow the Signs to [place] at     0600      am  Call this number if you have problems the morning of surgery: [phone]   Remember:   Do not eat food or drink liquids after midnight.   Take these medicines the morning of surgery with A SIP OF WATER:    Do not wear jewelry,   Do not wear lotions, powders, or perfumes.   . Men may shave face and neck.  Do not bring valuables to the hospital.  Contacts, dentures or bridgework may not be worn into surgery.  Leave suitcase in the car. After surgery it may be brought to your room.  For patients admitted to the hospital, checkout time is 11:00 AM the day of  discharge.     [place] - Preparing for Surgery Before surgery, you can play an important role.  Because skin is not sterile, your skin needs to be as free of germs as possible.  You can reduce the number of germs on your skin by washing with CHG (chlorahexidine gluconate) soap before surgery.  CHG is an antiseptic cleaner which kills germs and bonds with the skin to continue killing germs even after washing. Please DO NOT use if you have an allergy to CHG or antibacterial soaps.  If your skin becomes reddened/irritated stop using the CHG and inform your nurse when you arrive at Short Stay. Do not shave (including legs and underarms) for at least 48 hours prior to the first CHG shower.  You may shave your face/neck. Please follow these instructions carefully:  1.  Shower with CHG Soap the night before surgery and the  morning of Surgery.  2.  If you choose to wash your hair, wash your hair first as usual with your  normal  shampoo.  3.  After you shampoo, rinse your hair and body thoroughly to remove the  shampoo.                           4.  Use CHG as you would any other liquid soap.  You can apply chg directly  to the skin and wash         Gently with a scrungie or clean washcloth.  5.  Apply the CHG Soap to your body ONLY FROM THE NECK DOWN.   Do not use on face/ open                           Wound or open sores. Avoid contact with eyes, ears mouth and genitals (private parts).                       Wash face,  Genitals (private parts) with your normal soap.             6.  Wash thoroughly, paying special attention to the area where your surgery  will be performed.  7.  Thoroughly rinse your body with warm water from the neck down.  8.  DO NOT shower/wash with your normal soap after using and rinsing off  the CHG Soap.                9.  Pat yourself dry with a clean towel.  10.  Wear clean pajamas.            11.  Place clean sheets on your bed the night of your first shower and do not  sleep with pets. Day of Surgery : Do not apply any lotions/deodorants the morning of surgery.  Please wear clean clothes to the hospital/surgery center.  FAILURE TO FOLLOW THESE INSTRUCTIONS MAY RESULT IN THE CANCELLATION OF YOUR SURGERY PATIENT SIGNATURE_________________________________  NURSE SIGNATURE__________________________________  ________________________________________________________________________   Jerry Chase  An incentive spirometer is a tool that can help keep your lungs clear and active. This tool measures how well you are filling your lungs with each breath. Taking long deep breaths may help reverse or decrease the chance of developing breathing (pulmonary) problems (especially infection) following:  A long period of time when you are unable to move or be active. BEFORE THE PROCEDURE   If the spirometer includes an indicator to show your best effort, your nurse or respiratory therapist will set it to a desired goal.  If possible, sit up straight or lean slightly forward. Try not to slouch.  Hold the incentive spirometer in an upright position. INSTRUCTIONS FOR USE  1. Sit on the edge of  your bed if possible, or sit up as far as you can in bed or on a chair. 2. Hold the incentive spirometer in an upright position. 3. Breathe out normally. 4. Place the mouthpiece in your mouth and seal your lips tightly around it. 5. Breathe in slowly and as deeply as possible, raising the piston or the ball toward the top of the column. 6. Hold your breath for 3-5 seconds or for as long as possible. Allow the piston or ball to fall to the bottom of the column. 7. Remove the mouthpiece from your mouth and breathe out normally. 8. Rest for a few seconds and repeat Steps 1 through 7 at least 10 times every 1-2 hours when you are awake. Take your time and take a few normal breaths between deep breaths. 9. The spirometer may include an indicator to show your best effort. Use the indicator as a goal to work toward during each repetition. 10. After each set of 10 deep breaths, practice coughing to be sure your lungs are clear. If you have an incision (the cut made at the time of surgery), support your incision when coughing by placing a pillow or rolled up towels firmly against it. Once you are able to get out of bed, walk around indoors and cough well. You may stop using the incentive spirometer when instructed by your caregiver.  RISKS AND COMPLICATIONS  Take your time so you do not get dizzy or light-headed.  If you are in pain, you may need to take or ask for pain medication before doing incentive spirometry. It is harder to take a deep breath if you are having pain. AFTER USE  Rest and breathe slowly and easily.  It can be helpful to keep track of a log of your progress. Your caregiver can provide you with a simple table to help with this. If you are using the spirometer at home, follow these instructions: [place] IF:   You are having difficultly using the spirometer.  You have trouble using the spirometer as often as instructed.  Your pain medication is not giving enough relief  while using the spirometer.  You develop fever of 100.5 F (38.1 C) or higher. SEEK IMMEDIATE MEDICAL CARE IF:   You cough up bloody  sputum that had not been present before.  You develop fever of 102 F (38.9 C) or greater.  You develop worsening pain at or near the incision site. MAKE SURE YOU:   Understand these instructions.  Will watch your condition.  Will get help right away if you are not doing well or get worse. Document Released: 11/12/2006 Document Revised: 09/24/2011 Document Reviewed: 01/13/2007 ExitCare Patient Information 2014 ExitCare, [place].   ________________________________________________________________________  WHAT IS A BLOOD TRANSFUSION? Blood Transfusion Information  A transfusion is the replacement of blood or some of its parts. Blood is made up of multiple cells which provide different functions.  Red blood cells carry oxygen and are used for blood loss replacement.  White blood cells fight against infection.  Platelets control bleeding.  Plasma helps clot blood.  Other blood products are available for specialized needs, such as hemophilia or other clotting disorders. BEFORE THE TRANSFUSION  Who gives blood for transfusions?   Healthy volunteers who are fully evaluated to make sure their blood is safe. This is blood bank blood. Transfusion therapy is the safest it has ever been in the practice of medicine. Before blood is taken from a donor, a complete history is taken to make sure that person has no history of diseases nor engages in risky social behavior (examples are intravenous drug use or sexual activity with multiple partners). The donor's travel history is screened to minimize risk of transmitting infections, such as malaria. The donated blood is tested for signs of infectious diseases, such as HIV and hepatitis. The blood is then tested to be sure it is compatible with you in order to minimize the chance of a transfusion reaction. If you or  a relative donates blood, this is often done in anticipation of surgery and is not appropriate for emergency situations. It takes many days to process the donated blood. RISKS AND COMPLICATIONS Although transfusion therapy is very safe and saves many lives, the main dangers of transfusion include:   Getting an infectious disease.  Developing a transfusion reaction. This is an allergic reaction to something in the blood you were given. Every precaution is taken to prevent this. The decision to have a blood transfusion has been considered carefully by your caregiver before blood is given. Blood is not given unless the benefits outweigh the risks. AFTER THE TRANSFUSION  Right after receiving a blood transfusion, you will usually feel much better and more energetic. This is especially true if your red blood cells have gotten low (anemic). The transfusion raises the level of the red blood cells which carry oxygen, and this usually causes an energy increase.  The nurse administering the transfusion will monitor you carefully for complications. HOME CARE INSTRUCTIONS  No special instructions are needed after a transfusion. You may find your energy is better. Speak with your caregiver about any limitations on activity for underlying diseases you may have. SEEK MEDICAL CARE IF:   Your condition is not improving after your transfusion.  You develop redness or irritation at the intravenous (IV) site. SEEK IMMEDIATE MEDICAL CARE IF:  Any of the following symptoms occur over the next 12 hours:  Shaking chills.  You have a temperature by mouth above 102 F (38.9 C), not controlled by medicine.  Chest, back, or muscle pain.  People around you feel you are not acting correctly or are confused.  Shortness of breath or difficulty breathing.  Dizziness and fainting.  You get a rash or develop hives.  You have a decrease  in urine output.  Your urine turns a dark color or changes to pink, red, or  brown. Any of the following symptoms occur over the next 10 days:  You have a temperature by mouth above 102 F (38.9 C), not controlled by medicine.  Shortness of breath.  Weakness after normal activity.  The white part of the eye turns yellow (jaundice).  You have a decrease in the amount of urine or are urinating less often.  Your urine turns a dark color or changes to pink, red, or brown. Document Released: 06/29/2000 Document Revised: 09/24/2011 Document Reviewed: 02/16/2008 ExitCare Patient Information 2014 ExitCare, [place].  _______________________________________________________________________   Please read over the following fact sheets that you were given: MRSA Information, coughing and deep breathing exercises, leg exercises

## 2013-12-09 ENCOUNTER — Encounter (HOSPITAL_COMMUNITY): Payer: Self-pay

## 2013-12-09 ENCOUNTER — Encounter (HOSPITAL_COMMUNITY)
Admission: RE | Admit: 2013-12-09 | Discharge: 2013-12-09 | Disposition: A | Discharge status: Home / Self Care | Payer: Medicare Other | Source: Ambulatory Visit | Attending: Orthopedic Surgery | Admitting: Orthopedic Surgery

## 2013-12-09 DIAGNOSIS — Z01810 Encounter for preprocedural cardiovascular examination: Secondary | ICD-10-CM | POA: Insufficient documentation

## 2013-12-09 DIAGNOSIS — Z01812 Encounter for preprocedural laboratory examination: Secondary | ICD-10-CM | POA: Insufficient documentation

## 2013-12-09 HISTORY — DX: Cardiac arrhythmia, unspecified: I49.9

## 2013-12-09 LAB — URINALYSIS, ROUTINE W REFLEX MICROSCOPIC
Bilirubin Urine: NEGATIVE
GLUCOSE, UA: NEGATIVE mg/dL
HGB URINE DIPSTICK: NEGATIVE
Ketones, ur: NEGATIVE mg/dL
Nitrite: NEGATIVE
Protein, ur: NEGATIVE mg/dL
Specific Gravity, Urine: 1.012 (ref 1.005–1.030)
Urobilinogen, UA: 0.2 mg/dL (ref 0.0–1.0)
pH: 5.5 (ref 5.0–8.0)

## 2013-12-09 LAB — URINE MICROSCOPIC-ADD ON

## 2013-12-09 LAB — COMPREHENSIVE METABOLIC PANEL
ALK PHOS: 121 U/L — AB (ref 39–117)
ALT: 44 U/L (ref 0–53)
AST: 28 U/L (ref 0–37)
Albumin: 3.6 g/dL (ref 3.5–5.2)
BUN: 29 mg/dL — ABNORMAL HIGH (ref 6–23)
CO2: 26 meq/L (ref 19–32)
Calcium: 9.4 mg/dL (ref 8.4–10.5)
Chloride: 100 mEq/L (ref 96–112)
Creatinine, Ser: 1.14 mg/dL (ref 0.50–1.35)
GFR calc Af Amer: 70 mL/min — ABNORMAL LOW (ref >90)
GFR, EST NON AFRICAN AMERICAN: 60 mL/min — AB (ref >90)
Glucose, Bld: 102 mg/dL — ABNORMAL HIGH (ref 70–99)
POTASSIUM: 4.1 meq/L (ref 3.7–5.3)
SODIUM: 139 meq/L (ref 137–147)
Total Bilirubin: 0.6 mg/dL (ref 0.3–1.2)
Total Protein: 7.1 g/dL (ref 6.0–8.3)

## 2013-12-09 LAB — CBC
HCT: 42.9 % (ref 39.0–52.0)
HEMOGLOBIN: 14 g/dL (ref 13.0–17.0)
MCH: 29.7 pg (ref 26.0–34.0)
MCHC: 32.6 g/dL (ref 30.0–36.0)
MCV: 91.1 fL (ref 78.0–100.0)
Platelets: 292 10*3/uL (ref 150–400)
RBC: 4.71 MIL/uL (ref 4.22–5.81)
RDW: 13.5 % (ref 11.5–15.5)
WBC: 10.3 10*3/uL (ref 4.0–10.5)

## 2013-12-09 LAB — SURGICAL PCR SCREEN
MRSA, PCR: NEGATIVE
STAPHYLOCOCCUS AUREUS: NEGATIVE

## 2013-12-09 LAB — PROTIME-INR
INR: 1.43 (ref 0.00–1.49)
Prothrombin Time: 17.1 seconds — ABNORMAL HIGH (ref 11.6–15.2)

## 2013-12-09 LAB — APTT: aPTT: 36 seconds (ref 24–37)

## 2013-12-09 NOTE — Progress Notes (Addendum)
CXR 09/03/13 EPIC  08/31/13 ECHO- on chart  Stress 09/04/13 on chart  Clearance on chart  Cardioversion 11/24/13 on chart ( note on chart )  LOV 11/13/13 Dr Einar Gip on chart  EKG 12/03/2013 on chart along with last office visit note- Dr Ganji5/21/15 on chart

## 2013-12-13 ENCOUNTER — Other Ambulatory Visit: Payer: Self-pay | Admitting: Orthopedic Surgery

## 2013-12-13 NOTE — Anesthesia Preprocedure Evaluation (Addendum)
Anesthesia Evaluation  Patient identified by MRN, date of birth, ID band Patient awake    Reviewed: Allergy & Precautions, H&P , NPO status , Patient's Chart, lab work & pertinent test results, reviewed documented beta blocker date and time   Airway Mallampati: II TM Distance: >3 FB Neck ROM: full    Dental  (+) Edentulous Upper, Edentulous Lower, Dental Advisory Given   Pulmonary asthma , former smoker,  Mild asthma breath sounds clear to auscultation  Pulmonary exam normal       Cardiovascular hypertension, Pt. on home beta blockers + dysrhythmias Atrial Fibrillation Rhythm:regular Rate:Normal  2 cardioversions 2015. ECG SB RBBB   Neuro/Psych negative neurological ROS  negative psych ROS   GI/Hepatic negative GI ROS, Neg liver ROS,   Endo/Other  negative endocrine ROS  Renal/GU negative Renal ROS  negative genitourinary   Musculoskeletal   Abdominal   Peds  Hematology negative hematology ROS (+)   Anesthesia Other Findings   Reproductive/Obstetrics negative OB ROS                          Anesthesia Physical Anesthesia Plan  ASA: III  Anesthesia Plan: General   Post-op Pain Management:    Induction: Intravenous  Airway Management Planned: Oral ETT  Additional Equipment:   Intra-op Plan:   Post-operative Plan: Extubation in OR  Informed Consent: I have reviewed the patients History and Physical, chart, labs and discussed the procedure including the risks, benefits and alternatives for the proposed anesthesia with the patient or authorized representative who has indicated his/her understanding and acceptance.   Dental Advisory Given  Plan Discussed with: CRNA and Surgeon  Anesthesia Plan Comments:        Anesthesia Quick Evaluation

## 2013-12-13 NOTE — H&P (Signed)
Woodward Ku DOB: Sep 01, 1934 Married / Language: Cleophus Molt / Race: White Male  Date of Admission:  12-14-2013  Chief Complaint:  Left Knee Pain  History of Present Illness The patient is a 79 year old male who comes in  for a preoperative History and Physical. The patient is scheduled for a left total knee arthroplasty to be performed by Dr. Dione Plover. Aluisio, MD at [facility] on 12-14-2013. The patient is a 79 year old male who presents today for follow up of their knee. The patient is being followed for their left knee pain and osteoarthritis. Symptoms reported today include: pain. The patient feels that they are doing poorly and report their pain level to be mild (varies in severity). Current treatment includes: pain medications (tramadol). The following medication has been used for pain control: Ultram (tramadol). The patient presents post synvisc series. Note for "Follow-up Knee": Knee still hurts 90% of the time. Hurts all the time when he walks. Difficulty getting comfortable at night. Tramadol does help, needing refill. The patient states that the knee is preventing him from doing things that he desires. He said that the cortisone did help some, but not to the point where he is able to do things. He is at a stage where he feels like the knee is having a very negative effect on his lifestyle. He was originally scheduled for surgery on Feb 16th of this year but was postponed due to an abnormal EKG. He has since been evaluated by Dr. Einar Gip and felt to be stable for surgery but at moderate risk for a CV event. He would like to proceed with knee replacement at this time. They have been treated conservatively in the past for the above stated problem and despite conservative measures, they continue to have progressive pain and severe functional limitations and dysfunction. They have failed non-operative management including home exercise, medications, and injections. It is felt  that they would benefit from undergoing total joint replacement. Risks and benefits of the procedure have been discussed with the patient and they elect to proceed with surgery. There are no active contraindications to surgery such as ongoing infection or rapidly progressive neurological disease.  Allergies No Known Drug Allergies  Problem List/Past Medical Osteoarthritis, Knee (715.96) Hypercholesterolemia Asthma High blood pressure Shingles Bronchitis COPD Hypertension Atrial Fibrillation    Family History Cancer. mother Hypertension. Father, Brother. father and brother    Social History Tobacco use. Current some day smoker. current some days smoker; smoke(d) less than 1/2 pack(s) per day Pain Contract. no Tobacco / smoke exposure. yes outdoors only Drug/Alcohol Rehab (Currently). no Drug/Alcohol Rehab (Previously). no Current work status. retired No alcohol use Children. 1 Marital status. married Number of flights of stairs before winded. greater than 5 Living situation. live with spouse Exercise. Exercises daily; does running / walking Illicit drug use. no Post-Surgical Plans. Home    Medication History Pravastatin Sodium (80MG  Tablet, Oral) Active. Valsartan-Hydrochlorothiazide (160-25MG  Tablet, Oral) Active. Xarelto (20MG  Tablet, Oral) Active. Metoprolol Tartrate (25MG  Tablet, Oral) Active. Vitamin D (50000U Tablet, Oral once a week) Active. Advair Diskus (250-50MCG/DOSE Aero Pow Br Act, Inhalation) Active. Combivent (18-103MCG/ACT Aerosol, Inhalation) Active. Digoxin (250MCG Tablet, Oral) Active. Spironolactone (25MG  Tablet, Oral) Active. Amiodarone HCl (200MG  Tablet, Oral) Active.    Past Surgical History Right Hand Surgery   Review of Systems General:Not Present- Chills, Fever, Night Sweats, Fatigue, Weight Gain, Weight Loss and Memory Loss. Skin:Not Present- Hives, Itching, Rash, Eczema and Lesions. HEENT:Not  Present- Tinnitus, Headache,  Double Vision, Visual Loss, Hearing Loss and Dentures. Respiratory:Present- Shortness of breath with exertion, Cough and Wheezing. Not Present- Shortness of breath at rest, Allergies, Coughing up blood and Chronic Cough. Cardiovascular:Not Present- Chest Pain, Racing/skipping heartbeats, Difficulty Breathing Lying Down, Murmur, Swelling and Palpitations. Gastrointestinal:Not Present- Bloody Stool, Heartburn, Abdominal Pain, Vomiting, Nausea, Constipation, Diarrhea, Difficulty Swallowing, Jaundice and Loss of appetitie. Male Genitourinary:Present- Urinating at Night. Not Present- Urinary frequency, Blood in Urine, Weak urinary stream, Discharge, Flank Pain, Incontinence, Painful Urination, Urgency and Urinary Retention. Musculoskeletal:Present- Joint Pain. Not Present- Muscle Weakness, Muscle Pain, Joint Swelling, Back Pain, Morning Stiffness and Spasms. Neurological:Not Present- Tremor, Dizziness, Blackout spells, Paralysis, Difficulty with balance and Weakness. Psychiatric:Not Present- Insomnia.    Vitals Pulse: 52 (Regular) Resp.: 12 (Unlabored) BP: 132/60 (Sitting, Right Arm, Standard)     Physical Exam The physical exam findings are as follows:   General Mental Status - Alert, cooperative and good historian. General Appearance- pleasant. Not in acute distress. Orientation- Oriented X3. Build & Nutrition- Well nourished and Well developed.   Head and Neck Head- normocephalic, atraumatic . Neck Global Assessment- supple. no bruit auscultated on the right and no bruit auscultated on the left.   Note: upper and lower denture plates  Eye Vision- Wears corrective lenses. Pupil- Bilateral- Regular and Round. Motion- Bilateral- EOMI.   Chest and Lung Exam Auscultation: Breath sounds:- clear at anterior chest wall and - clear at posterior chest wall. Adventitious sounds:- No Adventitious  sounds.   Cardiovascular Auscultation:Rhythm- Regular rate and rhythm. Heart Sounds- S1 WNL and S2 WNL. Murmurs & Other Heart Sounds:Auscultation of the heart reveals - No Murmurs.   Abdomen Palpation/Percussion:Tenderness- Abdomen is non-tender to palpation. Rigidity (guarding)- Abdomen is soft. Auscultation:Auscultation of the abdomen reveals - Bowel sounds normal.   Male Genitourinary Not done, not pertinent to present illness  Musculoskeletal On exam well developed male, alert and oriented in on apparent distress. Evaluation of his left knee shows no effusion. He has slight valgus. He has marked crepitus on range of motion. He is tender lateral greater than medial with no instability. Pulse sensation and motor intact.  RADIOGRAPHS: We reviewed his radiographs from earlier this year. He is bone on bone lateral and patellofemoral.   Assessment & Plan Osteoarthritis, Knee (715.96) Impression: Left Knee  Note: Plan is for a Left Total Knee Replacement by Dr. Wynelle Link.  Plan is to go home.  Cards - Dr. Einar Gip - Patient has been seen preoperatively and felt to be stable for surgery. He was instructed to hold his Xarelto for 48 hours prior to the surgery.  The patient does not have any contraindications and will receive TXA (tranexamic acid) prior to surgery.  Signed electronically by Joelene Millin, III PA-C

## 2013-12-14 ENCOUNTER — Inpatient Hospital Stay (HOSPITAL_COMMUNITY)
Admission: RE | Admit: 2013-12-14 | Discharge: 2013-12-16 | DRG: 470 | Disposition: A | Discharge status: Home Health | Payer: Medicare Other | Source: Ambulatory Visit | Attending: Orthopedic Surgery | Admitting: Orthopedic Surgery

## 2013-12-14 ENCOUNTER — Encounter (HOSPITAL_COMMUNITY)
Admission: RE | Disposition: A | Discharge status: Home Health | Payer: Self-pay | Source: Ambulatory Visit | Attending: Orthopedic Surgery

## 2013-12-14 ENCOUNTER — Encounter (HOSPITAL_COMMUNITY): Payer: Self-pay | Admitting: *Deleted

## 2013-12-14 ENCOUNTER — Encounter (HOSPITAL_COMMUNITY): Payer: Medicare Other | Admitting: Anesthesiology

## 2013-12-14 ENCOUNTER — Inpatient Hospital Stay (HOSPITAL_COMMUNITY): Payer: Medicare Other | Admitting: Anesthesiology

## 2013-12-14 DIAGNOSIS — Z8249 Family history of ischemic heart disease and other diseases of the circulatory system: Secondary | ICD-10-CM

## 2013-12-14 DIAGNOSIS — Z6829 Body mass index (BMI) 29.0-29.9, adult: Secondary | ICD-10-CM

## 2013-12-14 DIAGNOSIS — F17200 Nicotine dependence, unspecified, uncomplicated: Secondary | ICD-10-CM | POA: Diagnosis present

## 2013-12-14 DIAGNOSIS — I10 Essential (primary) hypertension: Secondary | ICD-10-CM | POA: Diagnosis present

## 2013-12-14 DIAGNOSIS — Z79899 Other long term (current) drug therapy: Secondary | ICD-10-CM

## 2013-12-14 DIAGNOSIS — N478 Other disorders of prepuce: Secondary | ICD-10-CM | POA: Diagnosis present

## 2013-12-14 DIAGNOSIS — J45909 Unspecified asthma, uncomplicated: Secondary | ICD-10-CM | POA: Diagnosis present

## 2013-12-14 DIAGNOSIS — M1710 Unilateral primary osteoarthritis, unspecified knee: Secondary | ICD-10-CM | POA: Diagnosis present

## 2013-12-14 DIAGNOSIS — Z96652 Presence of left artificial knee joint: Secondary | ICD-10-CM

## 2013-12-14 DIAGNOSIS — D409 Neoplasm of uncertain behavior of male genital organ, unspecified: Secondary | ICD-10-CM | POA: Diagnosis present

## 2013-12-14 DIAGNOSIS — E785 Hyperlipidemia, unspecified: Secondary | ICD-10-CM | POA: Diagnosis present

## 2013-12-14 DIAGNOSIS — I4891 Unspecified atrial fibrillation: Secondary | ICD-10-CM | POA: Diagnosis present

## 2013-12-14 DIAGNOSIS — Z01812 Encounter for preprocedural laboratory examination: Secondary | ICD-10-CM

## 2013-12-14 DIAGNOSIS — M179 Osteoarthritis of knee, unspecified: Secondary | ICD-10-CM | POA: Diagnosis present

## 2013-12-14 DIAGNOSIS — N471 Phimosis: Secondary | ICD-10-CM | POA: Diagnosis present

## 2013-12-14 HISTORY — PX: TOTAL KNEE ARTHROPLASTY: SHX125

## 2013-12-14 LAB — TYPE AND SCREEN
ABO/RH(D): A POS
Antibody Screen: NEGATIVE

## 2013-12-14 LAB — PROTIME-INR
INR: 0.93 (ref 0.00–1.49)
PROTHROMBIN TIME: 12.3 s (ref 11.6–15.2)

## 2013-12-14 SURGERY — ARTHROPLASTY, KNEE, TOTAL
Anesthesia: General | Site: Knee | Laterality: Left

## 2013-12-14 MED ORDER — GLYCOPYRROLATE 0.2 MG/ML IJ SOLN
INTRAMUSCULAR | Status: AC
  Filled 2013-12-14: qty 3

## 2013-12-14 MED ORDER — ACETAMINOPHEN 650 MG RE SUPP: 650.0000 mg | Freq: Four times a day (QID) | RECTAL | Status: DC | PRN

## 2013-12-14 MED ORDER — ONDANSETRON HCL 4 MG PO TABS: 4.0000 mg | ORAL_TABLET | Freq: Four times a day (QID) | ORAL | Status: DC | PRN

## 2013-12-14 MED ORDER — DIPHENHYDRAMINE HCL 12.5 MG/5ML PO ELIX: 12.5000 mg | ORAL_SOLUTION | ORAL | Status: DC | PRN

## 2013-12-14 MED ORDER — METOCLOPRAMIDE HCL 5 MG/ML IJ SOLN: 5.0000 mg | Freq: Three times a day (TID) | INTRAMUSCULAR | Status: DC | PRN

## 2013-12-14 MED ORDER — BISACODYL 10 MG RE SUPP: 10.0000 mg | Freq: Every day | RECTAL | Status: DC | PRN

## 2013-12-14 MED ORDER — IPRATROPIUM-ALBUTEROL 20-100 MCG/ACT IN AERS
1.0000 | INHALATION_SPRAY | Freq: Four times a day (QID) | RESPIRATORY_TRACT | Status: DC
  Administered 2013-12-14 – 2013-12-15 (×6): 1 via RESPIRATORY_TRACT

## 2013-12-14 MED ORDER — RIVAROXABAN 10 MG PO TABS
10.0000 mg | ORAL_TABLET | Freq: Every day | ORAL | Status: DC
  Administered 2013-12-15 – 2013-12-16 (×2): 10 mg via ORAL
  Filled 2013-12-14 (×4): qty 1

## 2013-12-14 MED ORDER — SPIRONOLACTONE 25 MG PO TABS
25.0000 mg | ORAL_TABLET | Freq: Every morning | ORAL | Status: DC
  Administered 2013-12-15 – 2013-12-16 (×2): 25 mg via ORAL
  Filled 2013-12-14 (×2): qty 1

## 2013-12-14 MED ORDER — IPRATROPIUM-ALBUTEROL 20-100 MCG/ACT IN AERS
1.0000 | INHALATION_SPRAY | Freq: Four times a day (QID) | RESPIRATORY_TRACT | Status: DC
  Filled 2013-12-14: qty 4

## 2013-12-14 MED ORDER — BUPIVACAINE LIPOSOME 1.3 % IJ SUSP
INTRAMUSCULAR | Status: DC | PRN
  Administered 2013-12-14: 20 mL

## 2013-12-14 MED ORDER — NEOSTIGMINE METHYLSULFATE 10 MG/10ML IV SOLN
INTRAVENOUS | Status: AC
  Filled 2013-12-14: qty 1

## 2013-12-14 MED ORDER — POLYETHYLENE GLYCOL 3350 17 G PO PACK: 17.0000 g | PACK | Freq: Every day | ORAL | Status: DC | PRN

## 2013-12-14 MED ORDER — ROCURONIUM BROMIDE 100 MG/10ML IV SOLN
INTRAVENOUS | Status: DC | PRN
  Administered 2013-12-14: 20 mg via INTRAVENOUS

## 2013-12-14 MED ORDER — CEFAZOLIN SODIUM-DEXTROSE 2-3 GM-% IV SOLR
2.0000 g | Freq: Four times a day (QID) | INTRAVENOUS | Status: AC
  Administered 2013-12-14 (×2): 2 g via INTRAVENOUS
  Filled 2013-12-14 (×2): qty 50

## 2013-12-14 MED ORDER — HYDROMORPHONE HCL PF 1 MG/ML IJ SOLN
0.2500 mg | INTRAMUSCULAR | Status: DC | PRN
  Administered 2013-12-14 (×2): 0.5 mg via INTRAVENOUS

## 2013-12-14 MED ORDER — IPRATROPIUM-ALBUTEROL 20-100 MCG/ACT IN AERS: 1.0000 | INHALATION_SPRAY | Freq: Four times a day (QID) | RESPIRATORY_TRACT | Status: DC

## 2013-12-14 MED ORDER — METHOCARBAMOL 500 MG PO TABS
500.0000 mg | ORAL_TABLET | Freq: Four times a day (QID) | ORAL | Status: DC | PRN
  Administered 2013-12-14 – 2013-12-15 (×2): 500 mg via ORAL
  Filled 2013-12-14 (×3): qty 1

## 2013-12-14 MED ORDER — VALSARTAN-HYDROCHLOROTHIAZIDE 160-25 MG PO TABS: 1.0000 | ORAL_TABLET | Freq: Every evening | ORAL | Status: DC

## 2013-12-14 MED ORDER — DEXAMETHASONE SODIUM PHOSPHATE 10 MG/ML IJ SOLN: 10.0000 mg | Freq: Once | INTRAMUSCULAR | Status: DC

## 2013-12-14 MED ORDER — DEXAMETHASONE 6 MG PO TABS
10.0000 mg | ORAL_TABLET | Freq: Every day | ORAL | Status: AC
  Administered 2013-12-15: 10 mg via ORAL
  Filled 2013-12-14: qty 1

## 2013-12-14 MED ORDER — MORPHINE SULFATE 2 MG/ML IJ SOLN
1.0000 mg | INTRAMUSCULAR | Status: DC | PRN
  Administered 2013-12-15: 1 mg via INTRAVENOUS
  Filled 2013-12-14: qty 1

## 2013-12-14 MED ORDER — BUPIVACAINE HCL 0.25 % IJ SOLN
INTRAMUSCULAR | Status: DC | PRN
  Administered 2013-12-14: 20 mL

## 2013-12-14 MED ORDER — DEXAMETHASONE SODIUM PHOSPHATE 10 MG/ML IJ SOLN
INTRAMUSCULAR | Status: AC
  Filled 2013-12-14: qty 1

## 2013-12-14 MED ORDER — SODIUM CHLORIDE 0.9 % IV SOLN: INTRAVENOUS | Status: DC

## 2013-12-14 MED ORDER — CEFAZOLIN SODIUM-DEXTROSE 2-3 GM-% IV SOLR
INTRAVENOUS | Status: AC
  Filled 2013-12-14: qty 50

## 2013-12-14 MED ORDER — ACETAMINOPHEN 500 MG PO TABS
1000.0000 mg | ORAL_TABLET | Freq: Four times a day (QID) | ORAL | Status: AC
  Administered 2013-12-14 – 2013-12-15 (×4): 1000 mg via ORAL
  Filled 2013-12-14 (×4): qty 2

## 2013-12-14 MED ORDER — FLEET ENEMA 7-19 GM/118ML RE ENEM: 1.0000 | ENEMA | Freq: Once | RECTAL | Status: AC | PRN

## 2013-12-14 MED ORDER — TRANEXAMIC ACID 100 MG/ML IV SOLN
1000.0000 mg | INTRAVENOUS | Status: AC
  Administered 2013-12-14: 1000 mg via INTRAVENOUS
  Filled 2013-12-14: qty 10

## 2013-12-14 MED ORDER — DEXAMETHASONE SODIUM PHOSPHATE 10 MG/ML IJ SOLN
10.0000 mg | Freq: Every day | INTRAMUSCULAR | Status: AC
  Filled 2013-12-14: qty 1

## 2013-12-14 MED ORDER — FENTANYL CITRATE 0.05 MG/ML IJ SOLN
INTRAMUSCULAR | Status: DC | PRN
  Administered 2013-12-14 (×2): 25 ug via INTRAVENOUS
  Administered 2013-12-14: 50 ug via INTRAVENOUS
  Administered 2013-12-14: 25 ug via INTRAVENOUS
  Administered 2013-12-14 (×4): 50 ug via INTRAVENOUS
  Administered 2013-12-14: 25 ug via INTRAVENOUS

## 2013-12-14 MED ORDER — METOPROLOL TARTRATE 25 MG PO TABS
25.0000 mg | ORAL_TABLET | Freq: Two times a day (BID) | ORAL | Status: DC
  Administered 2013-12-14 – 2013-12-16 (×4): 25 mg via ORAL
  Filled 2013-12-14 (×5): qty 1

## 2013-12-14 MED ORDER — LACTATED RINGERS IV SOLN: INTRAVENOUS | Status: DC

## 2013-12-14 MED ORDER — NEOSTIGMINE METHYLSULFATE 10 MG/10ML IV SOLN
INTRAVENOUS | Status: DC | PRN
  Administered 2013-12-14: 4 mg via INTRAVENOUS

## 2013-12-14 MED ORDER — ONDANSETRON HCL 4 MG/2ML IJ SOLN
4.0000 mg | Freq: Four times a day (QID) | INTRAMUSCULAR | Status: DC | PRN
  Administered 2013-12-15: 4 mg via INTRAVENOUS
  Filled 2013-12-14: qty 2

## 2013-12-14 MED ORDER — AMIODARONE HCL 200 MG PO TABS
200.0000 mg | ORAL_TABLET | Freq: Every morning | ORAL | Status: DC
  Administered 2013-12-15 – 2013-12-16 (×2): 200 mg via ORAL
  Filled 2013-12-14 (×2): qty 1

## 2013-12-14 MED ORDER — LACTATED RINGERS IV SOLN
INTRAVENOUS | Status: DC | PRN
  Administered 2013-12-14 (×2): via INTRAVENOUS

## 2013-12-14 MED ORDER — CHLORHEXIDINE GLUCONATE 4 % EX LIQD: 60.0000 mL | Freq: Once | CUTANEOUS | Status: DC

## 2013-12-14 MED ORDER — ONDANSETRON HCL 4 MG/2ML IJ SOLN
INTRAMUSCULAR | Status: AC
  Filled 2013-12-14: qty 2

## 2013-12-14 MED ORDER — OXYCODONE HCL 5 MG PO TABS
5.0000 mg | ORAL_TABLET | ORAL | Status: DC | PRN
  Administered 2013-12-14 (×2): 10 mg via ORAL
  Administered 2013-12-14 (×2): 5 mg via ORAL
  Administered 2013-12-15 – 2013-12-16 (×11): 10 mg via ORAL
  Filled 2013-12-14 (×2): qty 2
  Filled 2013-12-14: qty 1
  Filled 2013-12-14 (×8): qty 2
  Filled 2013-12-14: qty 1
  Filled 2013-12-14 (×3): qty 2

## 2013-12-14 MED ORDER — PHENOL 1.4 % MT LIQD
1.0000 | OROMUCOSAL | Status: DC | PRN
  Filled 2013-12-14: qty 177

## 2013-12-14 MED ORDER — ACETAMINOPHEN 10 MG/ML IV SOLN
1000.0000 mg | Freq: Once | INTRAVENOUS | Status: AC
  Administered 2013-12-14: 1000 mg via INTRAVENOUS
  Filled 2013-12-14: qty 100

## 2013-12-14 MED ORDER — SODIUM CHLORIDE 0.9 % IJ SOLN
INTRAMUSCULAR | Status: DC | PRN
  Administered 2013-12-14: 30 mL

## 2013-12-14 MED ORDER — PROPOFOL 10 MG/ML IV BOLUS
INTRAVENOUS | Status: AC
  Filled 2013-12-14: qty 20

## 2013-12-14 MED ORDER — FENTANYL CITRATE 0.05 MG/ML IJ SOLN
INTRAMUSCULAR | Status: AC
  Filled 2013-12-14: qty 5

## 2013-12-14 MED ORDER — BUPIVACAINE LIPOSOME 1.3 % IJ SUSP
20.0000 mL | Freq: Once | INTRAMUSCULAR | Status: DC
  Filled 2013-12-14: qty 20

## 2013-12-14 MED ORDER — HYDROCHLOROTHIAZIDE 25 MG PO TABS
25.0000 mg | ORAL_TABLET | Freq: Every evening | ORAL | Status: DC
Start: 2013-12-14 — End: 2013-12-16
  Administered 2013-12-14 – 2013-12-15 (×2): 25 mg via ORAL
  Filled 2013-12-14 (×3): qty 1

## 2013-12-14 MED ORDER — TRAMADOL HCL 50 MG PO TABS: 50.0000 mg | ORAL_TABLET | Freq: Four times a day (QID) | ORAL | Status: DC | PRN

## 2013-12-14 MED ORDER — DIGOXIN 250 MCG PO TABS
0.2500 mg | ORAL_TABLET | Freq: Every morning | ORAL | Status: DC
  Administered 2013-12-15 – 2013-12-16 (×2): 0.25 mg via ORAL
  Filled 2013-12-14 (×2): qty 1

## 2013-12-14 MED ORDER — GLYCOPYRROLATE 0.2 MG/ML IJ SOLN
INTRAMUSCULAR | Status: DC | PRN
  Administered 2013-12-14: 0.6 mg via INTRAVENOUS

## 2013-12-14 MED ORDER — SUCCINYLCHOLINE CHLORIDE 20 MG/ML IJ SOLN
INTRAMUSCULAR | Status: DC | PRN
  Administered 2013-12-14: 100 mg via INTRAVENOUS

## 2013-12-14 MED ORDER — SODIUM CHLORIDE 0.9 % IV SOLN
INTRAVENOUS | Status: DC
  Administered 2013-12-14 (×2): via INTRAVENOUS

## 2013-12-14 MED ORDER — BUPIVACAINE HCL (PF) 0.25 % IJ SOLN
INTRAMUSCULAR | Status: AC
  Filled 2013-12-14: qty 30

## 2013-12-14 MED ORDER — DEXAMETHASONE SODIUM PHOSPHATE 10 MG/ML IJ SOLN
INTRAMUSCULAR | Status: DC | PRN
  Administered 2013-12-14: 10 mg via INTRAVENOUS

## 2013-12-14 MED ORDER — ROCURONIUM BROMIDE 100 MG/10ML IV SOLN
INTRAVENOUS | Status: AC
Start: 2013-12-14 — End: 2013-12-14
  Filled 2013-12-14: qty 1

## 2013-12-14 MED ORDER — DOCUSATE SODIUM 100 MG PO CAPS
100.0000 mg | ORAL_CAPSULE | Freq: Two times a day (BID) | ORAL | Status: DC
  Administered 2013-12-14 – 2013-12-16 (×4): 100 mg via ORAL

## 2013-12-14 MED ORDER — MENTHOL 3 MG MT LOZG
1.0000 | LOZENGE | OROMUCOSAL | Status: DC | PRN
  Filled 2013-12-14: qty 9

## 2013-12-14 MED ORDER — SODIUM CHLORIDE 0.9 % IJ SOLN
INTRAMUSCULAR | Status: AC
  Filled 2013-12-14: qty 50

## 2013-12-14 MED ORDER — CEFAZOLIN SODIUM-DEXTROSE 2-3 GM-% IV SOLR
2.0000 g | INTRAVENOUS | Status: AC
  Administered 2013-12-14: 2 g via INTRAVENOUS

## 2013-12-14 MED ORDER — FLUTICASONE-SALMETEROL 250-50 MCG/DOSE IN AEPB
1.0000 | INHALATION_SPRAY | Freq: Two times a day (BID) | RESPIRATORY_TRACT | Status: DC
  Administered 2013-12-14 – 2013-12-15 (×3): 1 via RESPIRATORY_TRACT
  Filled 2013-12-14: qty 14

## 2013-12-14 MED ORDER — PROPOFOL 10 MG/ML IV BOLUS
INTRAVENOUS | Status: DC | PRN
  Administered 2013-12-14: 150 mg via INTRAVENOUS

## 2013-12-14 MED ORDER — HYDROMORPHONE HCL PF 1 MG/ML IJ SOLN
INTRAMUSCULAR | Status: AC
  Filled 2013-12-14: qty 1

## 2013-12-14 MED ORDER — ONDANSETRON HCL 4 MG/2ML IJ SOLN
INTRAMUSCULAR | Status: DC | PRN
  Administered 2013-12-14: 4 mg via INTRAVENOUS

## 2013-12-14 MED ORDER — METHOCARBAMOL 1000 MG/10ML IJ SOLN
500.0000 mg | Freq: Four times a day (QID) | INTRAMUSCULAR | Status: DC | PRN
  Administered 2013-12-14: 500 mg via INTRAVENOUS
  Filled 2013-12-14: qty 5

## 2013-12-14 MED ORDER — IRBESARTAN 150 MG PO TABS
150.0000 mg | ORAL_TABLET | Freq: Every evening | ORAL | Status: DC
  Administered 2013-12-14 – 2013-12-15 (×2): 150 mg via ORAL
  Filled 2013-12-14 (×3): qty 1

## 2013-12-14 MED ORDER — ACETAMINOPHEN 325 MG PO TABS: 650.0000 mg | ORAL_TABLET | Freq: Four times a day (QID) | ORAL | Status: DC | PRN

## 2013-12-14 MED ORDER — METOCLOPRAMIDE HCL 10 MG PO TABS: 5.0000 mg | ORAL_TABLET | Freq: Three times a day (TID) | ORAL | Status: DC | PRN

## 2013-12-14 MED ORDER — IPRATROPIUM-ALBUTEROL 0.5-2.5 (3) MG/3ML IN SOLN
3.0000 mL | Freq: Four times a day (QID) | RESPIRATORY_TRACT | Status: DC
  Filled 2013-12-14: qty 3

## 2013-12-14 MED ORDER — FENTANYL CITRATE 0.05 MG/ML IJ SOLN
INTRAMUSCULAR | Status: AC
  Filled 2013-12-14: qty 2

## 2013-12-14 MED ORDER — MOMETASONE FURO-FORMOTEROL FUM 100-5 MCG/ACT IN AERO
2.0000 | INHALATION_SPRAY | Freq: Two times a day (BID) | RESPIRATORY_TRACT | Status: DC
  Filled 2013-12-14: qty 8.8

## 2013-12-14 SURGICAL SUPPLY — 58 items
BAG ZIPLOCK 12X15 (MISCELLANEOUS) ×2 IMPLANT
BANDAGE ELASTIC 6 VELCRO ST LF (GAUZE/BANDAGES/DRESSINGS) ×2 IMPLANT
BANDAGE ESMARK 6X9 LF (GAUZE/BANDAGES/DRESSINGS) ×1 IMPLANT
BLADE SAG 18X100X1.27 (BLADE) ×2 IMPLANT
BLADE SAW SGTL 11.0X1.19X90.0M (BLADE) ×2 IMPLANT
BNDG ESMARK 6X9 LF (GAUZE/BANDAGES/DRESSINGS) ×2
BOWL SMART MIX CTS (DISPOSABLE) ×2 IMPLANT
CAPT RP KNEE ×2 IMPLANT
CEMENT HV SMART SET (Cement) ×4 IMPLANT
CUFF TOURN SGL QUICK 34 (TOURNIQUET CUFF) ×1
CUFF TRNQT CYL 34X4X40X1 (TOURNIQUET CUFF) ×1 IMPLANT
DECANTER SPIKE VIAL GLASS SM (MISCELLANEOUS) ×2 IMPLANT
DRAPE EXTREMITY T 121X128X90 (DRAPE) ×2 IMPLANT
DRAPE POUCH INSTRU U-SHP 10X18 (DRAPES) ×2 IMPLANT
DRAPE U-SHAPE 47X51 STRL (DRAPES) ×2 IMPLANT
DRSG ADAPTIC 3X8 NADH LF (GAUZE/BANDAGES/DRESSINGS) ×2 IMPLANT
DRSG PAD ABDOMINAL 8X10 ST (GAUZE/BANDAGES/DRESSINGS) ×2 IMPLANT
DURAPREP 26ML APPLICATOR (WOUND CARE) ×2 IMPLANT
ELECT REM PT RETURN 9FT ADLT (ELECTROSURGICAL) ×2
ELECTRODE REM PT RTRN 9FT ADLT (ELECTROSURGICAL) ×1 IMPLANT
EVACUATOR 1/8 PVC DRAIN (DRAIN) ×2 IMPLANT
FACESHIELD WRAPAROUND (MASK) ×12 IMPLANT
GLOVE BIO SURGEON STRL SZ7.5 (GLOVE) IMPLANT
GLOVE BIO SURGEON STRL SZ8 (GLOVE) ×2 IMPLANT
GLOVE BIOGEL PI IND STRL 6.5 (GLOVE) ×2 IMPLANT
GLOVE BIOGEL PI IND STRL 8 (GLOVE) ×1 IMPLANT
GLOVE BIOGEL PI INDICATOR 6.5 (GLOVE) ×2
GLOVE BIOGEL PI INDICATOR 8 (GLOVE) ×1
GLOVE SURG SS PI 6.5 STRL IVOR (GLOVE) IMPLANT
GOWN STRL REUS W/TWL LRG LVL3 (GOWN DISPOSABLE) ×2 IMPLANT
GOWN STRL REUS W/TWL XL LVL3 (GOWN DISPOSABLE) IMPLANT
HANDPIECE INTERPULSE COAX TIP (DISPOSABLE) ×1
IMMOBILIZER KNEE 20 (SOFTGOODS) ×2 IMPLANT
KIT BASIN OR (CUSTOM PROCEDURE TRAY) ×2 IMPLANT
MANIFOLD NEPTUNE II (INSTRUMENTS) ×2 IMPLANT
NDL SAFETY ECLIPSE 18X1.5 (NEEDLE) ×2 IMPLANT
NEEDLE HYPO 18GX1.5 SHARP (NEEDLE) ×2
NS IRRIG 1000ML POUR BTL (IV SOLUTION) ×2 IMPLANT
PACK TOTAL JOINT (CUSTOM PROCEDURE TRAY) ×2 IMPLANT
PAD ABD 8X10 STRL (GAUZE/BANDAGES/DRESSINGS) ×2 IMPLANT
PADDING CAST COTTON 6X4 STRL (CAST SUPPLIES) ×2 IMPLANT
POSITIONER SURGICAL ARM (MISCELLANEOUS) ×2 IMPLANT
SET HNDPC FAN SPRY TIP SCT (DISPOSABLE) ×1 IMPLANT
SPONGE GAUZE 4X4 12PLY (GAUZE/BANDAGES/DRESSINGS) ×2 IMPLANT
STRIP CLOSURE SKIN 1/2X4 (GAUZE/BANDAGES/DRESSINGS) ×2 IMPLANT
SUCTION FRAZIER 12FR DISP (SUCTIONS) ×2 IMPLANT
SUT MNCRL AB 4-0 PS2 18 (SUTURE) ×2 IMPLANT
SUT VIC AB 2-0 CT1 27 (SUTURE) ×3
SUT VIC AB 2-0 CT1 TAPERPNT 27 (SUTURE) ×3 IMPLANT
SUT VLOC 180 0 24IN GS25 (SUTURE) ×2 IMPLANT
SYR 20CC LL (SYRINGE) ×2 IMPLANT
SYR 50ML LL SCALE MARK (SYRINGE) ×2 IMPLANT
TOWEL OR 17X26 10 PK STRL BLUE (TOWEL DISPOSABLE) ×2 IMPLANT
TOWEL OR NON WOVEN STRL DISP B (DISPOSABLE) IMPLANT
TRAY FOLEY CATH 14FRSI W/METER (CATHETERS) IMPLANT
TRAY FOLEY CATH 16FRSI W/METER (SET/KITS/TRAYS/PACK) IMPLANT
WATER STERILE IRR 1500ML POUR (IV SOLUTION) ×2 IMPLANT
WRAP KNEE MAXI GEL POST OP (GAUZE/BANDAGES/DRESSINGS) ×2 IMPLANT

## 2013-12-14 NOTE — Progress Notes (Signed)
Patient ID: Jerry Chase, male   DOB: [birth year], 79 y.o.   MRN: [id number] Consult: difficult foley Requested by: Dr. Wynelle Link  History of Present Illness: 79 yo undergoing total knee replacement. He may be non-wt bearing post-op and may need urine drainage and UOP recorded. Pr H&P, no GU hx or trouble voiding pre-op. Nurses tried to place a foley and could not.   Past Medical History  Diagnosis Date  . Hypertension   . Hyperlipidemia   . Asthma   . Arthritis   . Dysrhythmia    Past Surgical History  Procedure Laterality Date  . Hand surgery  2005    rt - had pins put in  . Cardioversion N/A 09/29/2013    Procedure: CARDIOVERSION;  Surgeon: Laverda Page, MD;  Location: [facility] ENDOSCOPY;  Service: Cardiovascular;  Laterality: N/A;  11:35  Lido 20mg , IV, Propofol 60mg ,IV prior to 11:36 synched cardioversion @ 120 joules from  afib, unsuccessful,   11:37 repeated @ 200 joules resulting in unsustained SR with 1 degree heart block, returned to A fib, 11:38 Propofol 30mg ,IV given ...200 joules repeated  . Cardioversion N/A 11/24/2013    Procedure: CARDIOVERSION;  Surgeon: Laverda Page, MD;  Location: [facility];  Service: Cardiovascular;  Laterality: N/A;    Home Medications:  Prescriptions prior to admission  Medication Sig Dispense Refill  . amiodarone (PACERONE) 200 MG tablet Take 200 mg by mouth every morning.       . digoxin (LANOXIN) 0.25 MG tablet Take 0.25 mg by mouth every morning.       . Fluticasone-Salmeterol (ADVAIR) 250-50 MCG/DOSE AEPB Inhale 1 puff into the lungs 2 (two) times daily.      . Ipratropium-Albuterol (COMBIVENT RESPIMAT) 20-100 MCG/ACT AERS respimat Inhale 1 puff into the lungs 4 (four) times daily.       . metoprolol (LOPRESSOR) 50 MG tablet Take 25 mg by mouth 2 (two) times daily.      . pravastatin (PRAVACHOL) 80 MG tablet Take 80 mg by mouth every morning.       Marland Kitchen spironolactone (ALDACTONE) 25 MG tablet Take 25 mg by mouth every morning.       .  valsartan-hydrochlorothiazide (DIOVAN-HCT) 160-25 MG per tablet Take 1 tablet by mouth every evening.      . Rivaroxaban (XARELTO) 20 MG TABS tablet Take 20 mg by mouth daily with supper.      . Vitamin D, Ergocalciferol, (DRISDOL) 50000 UNITS CAPS capsule Take 50,000 Units by mouth every 7 (seven) days.       Allergies: No Known Allergies  History reviewed. No pertinent family history. Social History:  reports that he quit smoking about 6 weeks ago. He has quit using smokeless tobacco. His smokeless tobacco use included Chew. He reports that he does not drink alcohol or use illicit drugs.  ROS: A complete review of systems was performed.  All systems are negative except for pertinent findings as noted. Review of Systems  Unable to perform ROS    Physical Exam:  Vital signs in last 24 hours: Temp:  [98 F (36.7 C)] 98 F (36.7 C) (06/01 4332) Pulse Rate:  [60] 60 (06/01 0613) Resp:  [18] 18 (06/01 0613) BP: (130)/(53) 130/53 mmHg (06/01 0613) SpO2:  [96 %] 96 % (06/01 9518) General:  Pt asleep in OR under Gen anesthesia HEENT: Normocephalic, atraumatic Neck: No JVD or lymphadenopathy Cardiovascular: Regular rate and rhythm Lungs: Regular rate and effort Abdomen: Soft, nontender, nondistended, no abdominal masses Back: No  CVA tenderness Extremities: No edema Neurologic: under gen. anesthesia LAD: no inguinal LAD palpated GU: uncircumcised, phimosis, large, indurated, fungating mass involving foreskin.   Procedure - penis prepped and draped. 16Fr foley guided through foreskin, into glans, through urethra and into bladder without difficulty. Balloon inflated, seated at bladder neck. Clear urine return, foley left to gravity.    Laboratory Data:  Results for orders placed during the hospital encounter of 12/14/13 (from the past 24 hour(s))  PROTIME-INR     Status: None   Collection Time    12/14/13  6:40 AM      Result Value Ref Range   Prothrombin Time 12.3  11.6 - 15.2  seconds   INR 0.93  0.00 - 1.49   Recent Results (from the past 240 hour(s))  SURGICAL PCR SCREEN     Status: None   Collection Time    12/09/13  7:35 AM      Result Value Ref Range Status   MRSA, PCR NEGATIVE  NEGATIVE Final   Staphylococcus aureus NEGATIVE  NEGATIVE Final   Comment:            The Xpert SA Assay (FDA     approved for NASAL specimens     in patients over 39 years of age),     is one component of     a comprehensive surveillance     program.  Test performance has     been validated by [employer] for patients greater     than or equal to 69 year old.     It is not intended     to diagnose infection nor to     guide or monitor treatment.   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  2) phimosis - foley placed and might be difficult to replace. Continue foley until ambulatory, keep on a Bactrim DS or nitrofurantoin 100 mg po qhs x 24 hrs after foley removal.   [name] 12/14/2013, 9:58 AM

## 2013-12-14 NOTE — H&P (View-Only) (Signed)
Woodward Ku DOB: Sep 01, 1934 Married / Language: Cleophus Molt / Race: White Male  Date of Admission:  12-14-2013  Chief Complaint:  Left Knee Pain  History of Present Illness The patient is a 79 year old male who comes in  for a preoperative History and Physical. The patient is scheduled for a left total knee arthroplasty to be performed by Dr. Dione Plover. Aluisio, MD at [facility] on 12-14-2013. The patient is a 79 year old male who presents today for follow up of their knee. The patient is being followed for their left knee pain and osteoarthritis. Symptoms reported today include: pain. The patient feels that they are doing poorly and report their pain level to be mild (varies in severity). Current treatment includes: pain medications (tramadol). The following medication has been used for pain control: Ultram (tramadol). The patient presents post synvisc series. Note for "Follow-up Knee": Knee still hurts 90% of the time. Hurts all the time when he walks. Difficulty getting comfortable at night. Tramadol does help, needing refill. The patient states that the knee is preventing him from doing things that he desires. He said that the cortisone did help some, but not to the point where he is able to do things. He is at a stage where he feels like the knee is having a very negative effect on his lifestyle. He was originally scheduled for surgery on Feb 16th of this year but was postponed due to an abnormal EKG. He has since been evaluated by Dr. Einar Gip and felt to be stable for surgery but at moderate risk for a CV event. He would like to proceed with knee replacement at this time. They have been treated conservatively in the past for the above stated problem and despite conservative measures, they continue to have progressive pain and severe functional limitations and dysfunction. They have failed non-operative management including home exercise, medications, and injections. It is felt  that they would benefit from undergoing total joint replacement. Risks and benefits of the procedure have been discussed with the patient and they elect to proceed with surgery. There are no active contraindications to surgery such as ongoing infection or rapidly progressive neurological disease.  Allergies No Known Drug Allergies  Problem List/Past Medical Osteoarthritis, Knee (715.96) Hypercholesterolemia Asthma High blood pressure Shingles Bronchitis COPD Hypertension Atrial Fibrillation    Family History Cancer. mother Hypertension. Father, Brother. father and brother    Social History Tobacco use. Current some day smoker. current some days smoker; smoke(d) less than 1/2 pack(s) per day Pain Contract. no Tobacco / smoke exposure. yes outdoors only Drug/Alcohol Rehab (Currently). no Drug/Alcohol Rehab (Previously). no Current work status. retired No alcohol use Children. 1 Marital status. married Number of flights of stairs before winded. greater than 5 Living situation. live with spouse Exercise. Exercises daily; does running / walking Illicit drug use. no Post-Surgical Plans. Home    Medication History Pravastatin Sodium (80MG  Tablet, Oral) Active. Valsartan-Hydrochlorothiazide (160-25MG  Tablet, Oral) Active. Xarelto (20MG  Tablet, Oral) Active. Metoprolol Tartrate (25MG  Tablet, Oral) Active. Vitamin D (50000U Tablet, Oral once a week) Active. Advair Diskus (250-50MCG/DOSE Aero Pow Br Act, Inhalation) Active. Combivent (18-103MCG/ACT Aerosol, Inhalation) Active. Digoxin (250MCG Tablet, Oral) Active. Spironolactone (25MG  Tablet, Oral) Active. Amiodarone HCl (200MG  Tablet, Oral) Active.    Past Surgical History Right Hand Surgery   Review of Systems General:Not Present- Chills, Fever, Night Sweats, Fatigue, Weight Gain, Weight Loss and Memory Loss. Skin:Not Present- Hives, Itching, Rash, Eczema and Lesions. HEENT:Not  Present- Tinnitus, Headache,  Double Vision, Visual Loss, Hearing Loss and Dentures. Respiratory:Present- Shortness of breath with exertion, Cough and Wheezing. Not Present- Shortness of breath at rest, Allergies, Coughing up blood and Chronic Cough. Cardiovascular:Not Present- Chest Pain, Racing/skipping heartbeats, Difficulty Breathing Lying Down, Murmur, Swelling and Palpitations. Gastrointestinal:Not Present- Bloody Stool, Heartburn, Abdominal Pain, Vomiting, Nausea, Constipation, Diarrhea, Difficulty Swallowing, Jaundice and Loss of appetitie. Male Genitourinary:Present- Urinating at Night. Not Present- Urinary frequency, Blood in Urine, Weak urinary stream, Discharge, Flank Pain, Incontinence, Painful Urination, Urgency and Urinary Retention. Musculoskeletal:Present- Joint Pain. Not Present- Muscle Weakness, Muscle Pain, Joint Swelling, Back Pain, Morning Stiffness and Spasms. Neurological:Not Present- Tremor, Dizziness, Blackout spells, Paralysis, Difficulty with balance and Weakness. Psychiatric:Not Present- Insomnia.    Vitals Pulse: 52 (Regular) Resp.: 12 (Unlabored) BP: 132/60 (Sitting, Right Arm, Standard)     Physical Exam The physical exam findings are as follows:   General Mental Status - Alert, cooperative and good historian. General Appearance- pleasant. Not in acute distress. Orientation- Oriented X3. Build & Nutrition- Well nourished and Well developed.   Head and Neck Head- normocephalic, atraumatic . Neck Global Assessment- supple. no bruit auscultated on the right and no bruit auscultated on the left.   Note: upper and lower denture plates  Eye Vision- Wears corrective lenses. Pupil- Bilateral- Regular and Round. Motion- Bilateral- EOMI.   Chest and Lung Exam Auscultation: Breath sounds:- clear at anterior chest wall and - clear at posterior chest wall. Adventitious sounds:- No Adventitious  sounds.   Cardiovascular Auscultation:Rhythm- Regular rate and rhythm. Heart Sounds- S1 WNL and S2 WNL. Murmurs & Other Heart Sounds:Auscultation of the heart reveals - No Murmurs.   Abdomen Palpation/Percussion:Tenderness- Abdomen is non-tender to palpation. Rigidity (guarding)- Abdomen is soft. Auscultation:Auscultation of the abdomen reveals - Bowel sounds normal.   Male Genitourinary Not done, not pertinent to present illness  Musculoskeletal On exam well developed male, alert and oriented in on apparent distress. Evaluation of his left knee shows no effusion. He has slight valgus. He has marked crepitus on range of motion. He is tender lateral greater than medial with no instability. Pulse sensation and motor intact.  RADIOGRAPHS: We reviewed his radiographs from earlier this year. He is bone on bone lateral and patellofemoral.   Assessment & Plan Osteoarthritis, Knee (715.96) Impression: Left Knee  Note: Plan is for a Left Total Knee Replacement by Dr. Wynelle Link.  Plan is to go home.  Cards - Dr. Einar Gip - Patient has been seen preoperatively and felt to be stable for surgery. He was instructed to hold his Xarelto for 48 hours prior to the surgery.  The patient does not have any contraindications and will receive TXA (tranexamic acid) prior to surgery.  Signed electronically by Joelene Millin, III PA-C

## 2013-12-14 NOTE — Interval H&P Note (Signed)
History and Physical Interval Note:  12/14/2013 6:51 AM  Jerry Chase  has presented today for surgery, with the diagnosis of OA OF LEFT KNEE  The various methods of treatment have been discussed with the patient and family. After consideration of risks, benefits and other options for treatment, the patient has consented to  Procedure(s): LEFT TOTAL KNEE ARTHROPLASTY (Left) as a surgical intervention .  The patient's history has been reviewed, patient examined, no change in status, stable for surgery.  I have reviewed the patient's chart and labs.  Questions were answered to the patient's satisfaction.     Pilar Plate Alegandra Sommers V

## 2013-12-14 NOTE — Transfer of Care (Signed)
Immediate Anesthesia Transfer of Care Note  Patient: Jerry Chase  Procedure(s) Performed: Procedure(s): LEFT TOTAL KNEE ARTHROPLASTY (Left)  Patient Location: PACU  Anesthesia Type:General  Level of Consciousness: awake, alert  and patient cooperative  Airway & Oxygen Therapy: Patient Spontanous Breathing and Patient connected to face mask oxygen  Post-op Assessment: Report given to PACU RN and Post -op Vital signs reviewed and stable  Post vital signs: Reviewed and stable  Complications: No apparent anesthesia complications

## 2013-12-14 NOTE — Progress Notes (Signed)
Patient ID: Jerry Chase, male   DOB: 24-May-1935, 79 y.o.   MRN: [id number]  I discussed the intraoperative findings with the patient's daughter.  I stressed the importance of follow-up for circumcision as this appears to potentially be a penile cancer.

## 2013-12-14 NOTE — OR Nursing (Signed)
Noted growth around meatus, foley insertion attempted, unable to insert, PA notified. Received notice that a urologist will be attempting insertion after case, proceed with setup.   Liane Comber, RN

## 2013-12-14 NOTE — Op Note (Signed)
Pre-operative diagnosis- Osteoarthritis  Left knee(s)  Post-operative diagnosis- Osteoarthritis Left knee(s)  Procedure-  Left  Total Knee Arthroplasty  Surgeon- Jerry Plover. Aleecia Tapia, MD  Assistant- Arlee Muslim, PA-C   Anesthesia-  General  EBL-* No blood loss amount entered *   Drains Hemovac  Tourniquet time-  Total Tourniquet Time Documented: Thigh (Left) - 32 minutes Total: Thigh (Left) - 32 minutes     Complications- None  Condition-PACU - hemodynamically stable.   Brief Clinical Note   Jerry Chase is a 79 y.o. year old male with end stage OA of his left knee with progressively worsening pain and dysfunction. He has constant pain, with activity and at rest and significant functional deficits with difficulties even with ADLs. He has had extensive non-op management including analgesics, injections of cortisone and viscosupplements, and home exercise program, but remains in significant pain with significant dysfunction. Radiographs show bone on bone arthritis medial and patellofemoral. He presents now for left Total Knee Arthroplasty.     Procedure in detail---   The patient is brought into the operating room and positioned supine on the operating table. After successful administration of  General,   a tourniquet is placed high on the  Left thigh(s) and the lower extremity is prepped and draped in the usual sterile fashion. Time out is performed by the operating team and then the  Left lower extremity is wrapped in Esmarch, knee flexed and the tourniquet inflated to 300 mmHg.       A midline incision is made with a ten blade through the subcutaneous tissue to the level of the extensor mechanism. A fresh blade is used to make a medial parapatellar arthrotomy. Soft tissue over the proximal medial tibia is subperiosteally elevated to the joint line with a knife and into the semimembranosus bursa with a Cobb elevator. Soft tissue over the proximal lateral tibia is elevated with  attention being paid to avoiding the patellar tendon on the tibial tubercle. The patella is everted, knee flexed 90 degrees and the ACL and PCL are removed. Findings are bone on bone all 3 compartments with large osteophytes.        The drill is used to create a starting hole in the distal femur and the canal is thoroughly irrigated with sterile saline to remove the fatty contents. The 5 degree Left  valgus alignment guide is placed into the femoral canal and the distal femoral cutting block is pinned to remove 10 mm off the distal femur. Resection is made with an oscillating saw.      The tibia is subluxed forward and the menisci are removed. The extramedullary alignment guide is placed referencing proximally at the medial aspect of the tibial tubercle and distally along the second metatarsal axis and tibial crest. The block is pinned to remove 66mm off the more deficient medial  side. Resection is made with an oscillating saw. Size 4is the most appropriate size for the tibia and the proximal tibia is prepared with the modular drill and keel punch for that size.      The femoral sizing guide is placed and size 5 is most appropriate. Rotation is marked off the epicondylar axis and confirmed by creating a rectangular flexion gap at 90 degrees. The size 5 cutting block is pinned in this rotation and the anterior, posterior and chamfer cuts are made with the oscillating saw. The intercondylar block is then placed and that cut is made.      Trial size 4 tibial  component, trial size 5 posterior stabilized femur and a 10  mm posterior stabilized rotating platform insert trial is placed. Full extension is achieved with excellent varus/valgus and anterior/posterior balance throughout full range of motion. The patella is everted and thickness measured to be 27  mm. Free hand resection is taken to 15 mm, a 41 template is placed, lug holes are drilled, trial patella is placed, and it tracks normally. Osteophytes are removed  off the posterior femur with the trial in place. All trials are removed and the cut bone surfaces prepared with pulsatile lavage. Cement is mixed and once ready for implantation, the size 4 tibial implant, size  5 posterior stabilized femoral component, and the size 41 patella are cemented in place and the patella is held with the clamp. The trial insert is placed and the knee held in full extension. The Exparel (20 ml mixed with 30 ml saline) and .25% Bupivicaine, are injected into the extensor mechanism, posterior capsule, medial and lateral gutters and subcutaneous tissues.  All extruded cement is removed and once the cement is hard the permanent 10 mm posterior stabilized rotating platform insert is placed into the tibial tray.      The wound is copiously irrigated with saline solution and the extensor mechanism closed over a hemovac drain with #1 V-loc suture. The tourniquet is released for a total tourniquet time of 32  minutes. Flexion against gravity is 140 degrees and the patella tracks normally. Subcutaneous tissue is closed with 2.0 vicryl and subcuticular with running 4.0 Monocryl. The incision is cleaned and dried and steri-strips and a bulky sterile dressing are applied. The limb is placed into a knee immobilizer and the patient is awakened and transported to recovery in stable condition.      Please note that a surgical assistant was a medical necessity for this procedure in order to perform it in a safe and expeditious manner. Surgical assistant was necessary to retract the ligaments and vital neurovascular structures to prevent injury to them and also necessary for proper positioning of the limb to allow for anatomic placement of the prosthesis.   Jerry Plover [name], MD    12/14/2013, 9:26 AM

## 2013-12-14 NOTE — Plan of Care (Signed)
Problem: Consults Goal: Diagnosis- Total Joint Replacement Primary Total Knee     

## 2013-12-14 NOTE — Anesthesia Postprocedure Evaluation (Signed)
  Anesthesia Post-op Note  Patient: Jerry Chase  Procedure(s) Performed: Procedure(s) (LRB): LEFT TOTAL KNEE ARTHROPLASTY (Left)  Patient Location: PACU  Anesthesia Type: General  Level of Consciousness: awake and alert   Airway and Oxygen Therapy: Patient Spontanous Breathing  Post-op Pain: mild  Post-op Assessment: Post-op Vital signs reviewed, Patient's Cardiovascular Status Stable, Respiratory Function Stable, Patent Airway and No signs of Nausea or vomiting  Last Vitals:  Filed Vitals:   12/14/13 1145  BP: 163/70  Pulse: 67  Temp: 36.5 C  Resp: 16    Post-op Vital Signs: stable   Complications: No apparent anesthesia complications

## 2013-12-14 NOTE — Progress Notes (Signed)
Utilization review completed.  

## 2013-12-14 NOTE — Evaluation (Signed)
Physical Therapy Evaluation Patient Details Name: Jerry Chase MRN: [id number] DOB: Nov 04, 1934 Today's Date: 12/14/2013   History of Present Illness  s/p L TKA;  Clinical Impression  Pt  Will benefit from PT to address deficits below; Plan is for HHPT,pt has a RW    Follow Up Recommendations Home health PT    Equipment Recommendations  None recommended by PT    Recommendations for Other Services       Precautions / Restrictions Precautions Precautions: Fall;Knee Required Braces or Orthoses: Knee Immobilizer - Left Knee Immobilizer - Left: Discontinue once straight leg raise with < 10 degree lag Restrictions Other Position/Activity Restrictions: WBAT      Mobility  Bed Mobility Overal bed mobility: Needs Assistance Bed Mobility: Supine to Sit     Supine to sit: Supervision     General bed mobility comments: cues for technique  Transfers Overall transfer level: Needs assistance Equipment used: Rolling walker (2 wheeled) Transfers: Sit to/from Stand Sit to Stand: Min assist         General transfer comment: cues for hand placement, and wt shift  Ambulation/Gait Ambulation/Gait assistance: Min assist Ambulation Distance (Feet): 32 Feet Assistive device: Rolling walker (2 wheeled) Gait Pattern/deviations: Step-to pattern;Trunk flexed     General Gait Details: cues for sequence  Stairs            Wheelchair Mobility    Modified Rankin (Stroke Patients Only)       Balance                                             Pertinent Vitals/Pain Min c/o soreness L knee    Home Living Family/patient expects to be discharged to:: Private residence Living Arrangements: Spouse/significant other Available Help at Discharge: Family Type of Home: House Home Access: Level entry     Home Layout: One level Home Equipment: Environmental consultant - 2 wheels Additional Comments: pt plans to stay downstairs in basement/ no stairs initially    Prior  Function Level of Independence: Independent               Hand Dominance        Extremity/Trunk Assessment   Upper Extremity Assessment: Defer to OT evaluation           Lower Extremity Assessment: LLE deficits/detail   LLE Deficits / Details: ankle WFL; I SLR     Communication   Communication: No difficulties  Cognition Arousal/Alertness: Awake/alert Behavior During Therapy: WFL for tasks assessed/performed Overall Cognitive Status: Within Functional Limits for tasks assessed                      General Comments      Exercises Total Joint Exercises Ankle Circles/Pumps: AROM;Both;10 reps Quad Sets: 10 reps;Strengthening;Both      Assessment/Plan    PT Assessment Patient needs continued PT services  PT Diagnosis Difficulty walking   PT Problem List Decreased range of motion;Decreased strength;Decreased activity tolerance;Decreased mobility;Decreased balance;Decreased knowledge of use of DME;Decreased knowledge of precautions  PT Treatment Interventions DME instruction;Gait training;Functional mobility training;Therapeutic activities;Therapeutic exercise;Patient/family education   PT Goals (Current goals can be found in the Care Plan section) Acute Rehab PT Goals Patient Stated Goal: I PT Goal Formulation: With patient Time For Goal Achievement: 12/18/13 Potential to Achieve Goals: Good    Frequency 7X/week   Barriers to discharge  Co-evaluation               End of Session   Activity Tolerance: Patient tolerated treatment well Patient left: in chair;with call bell/phone within reach;with family/visitor present Nurse Communication: Mobility status         Time: 0177-9390 PT Time Calculation (min): 18 min   Charges:   PT Evaluation $Initial PT Evaluation Tier I: 1 Procedure PT Treatments $Gait Training: 8-22 mins   PT G Codes:          Jerry Chase 12/14/2013, 3:41 PM

## 2013-12-15 LAB — CBC
HCT: 35.1 % — ABNORMAL LOW (ref 39.0–52.0)
Hemoglobin: 11.5 g/dL — ABNORMAL LOW (ref 13.0–17.0)
MCH: 29.9 pg (ref 26.0–34.0)
MCHC: 32.8 g/dL (ref 30.0–36.0)
MCV: 91.2 fL (ref 78.0–100.0)
PLATELETS: 278 10*3/uL (ref 150–400)
RBC: 3.85 MIL/uL — ABNORMAL LOW (ref 4.22–5.81)
RDW: 13.8 % (ref 11.5–15.5)
WBC: 20.6 10*3/uL — ABNORMAL HIGH (ref 4.0–10.5)

## 2013-12-15 LAB — BASIC METABOLIC PANEL
BUN: 17 mg/dL (ref 6–23)
CO2: 25 mEq/L (ref 19–32)
CREATININE: 0.88 mg/dL (ref 0.50–1.35)
Calcium: 8.5 mg/dL (ref 8.4–10.5)
Chloride: 103 mEq/L (ref 96–112)
GFR calc non Af Amer: 81 mL/min — ABNORMAL LOW (ref >90)
Glucose, Bld: 132 mg/dL — ABNORMAL HIGH (ref 70–99)
Potassium: 4.7 mEq/L (ref 3.7–5.3)
Sodium: 139 mEq/L (ref 137–147)

## 2013-12-15 NOTE — Progress Notes (Signed)
Physical Therapy Treatment Patient Details Name: Jerry Chase MRN: [id number] DOB: 1935/06/26 Today's Date: 12/15/2013    History of Present Illness s/p L TKA;    PT Comments    POD # 1 am session.  Applied KI and instructed pt on KI use for amb.  Took BP's in supine, EOB and standing as pt reported dizziness earlier.  BP supine 144/74, EOB 136/69, standing 137/69.  Pt tolerated amb well.  Assisted to recliner to perform TKR TE's followed by ICE.  Follow Up Recommendations  Home health PT     Equipment Recommendations  None recommended by PT    Recommendations for Other Services       Precautions / Restrictions Precautions Precautions: Fall;Knee Precaution Comments: Instructed pt on KI use for amb Knee Immobilizer - Left: Discontinue once straight leg raise with < 10 degree lag Restrictions Other Position/Activity Restrictions: WBAT    Mobility  Bed Mobility Overal bed mobility: Needs Assistance Bed Mobility: Supine to Sit     Supine to sit: Min assist     General bed mobility comments: min assist to support L LE off bed  Transfers Overall transfer level: Needs assistance Equipment used: Rolling walker (2 wheeled)   Sit to Stand: Min assist         General transfer comment: cues for hand placement, and wt shift  Ambulation/Gait Ambulation/Gait assistance: Min assist Ambulation Distance (Feet): 55 Feet Assistive device: Rolling walker (2 wheeled) Gait Pattern/deviations: Step-to pattern;Trunk flexed;Decreased stance time - left Gait velocity: decreased   General Gait Details: cues for sequence and recliner chair following behind as pt reported dizziness with nursing earlier.     Stairs            Wheelchair Mobility    Modified Rankin (Stroke Patients Only)       Balance                                    Cognition                            Exercises   Total Knee Replacement TE's 10 reps B LE ankle  pumps 10 reps towel squeezes 10 reps knee presses 10 reps heel slides  10 reps SAQ's 10 reps SLR's 10 reps ABD Followed by ICE     General Comments        Pertinent Vitals/Pain C/o 4/10 with TE's ICE applied    Home Living                      Prior Function            PT Goals (current goals can now be found in the care plan section) Progress towards PT goals: Progressing toward goals    Frequency       PT Plan      Co-evaluation             End of Session Equipment Utilized During Treatment: Gait belt Activity Tolerance: Patient tolerated treatment well Patient left: in chair;with call bell/phone within reach;with family/visitor present     Time: 6295-2841 PT Time Calculation (min): 38 min  Charges:  $Gait Training: 8-22 mins $Therapeutic Exercise: 8-22 mins $Therapeutic Activity: 8-22 mins                    G Codes:  Rica Koyanagi  PTA [facility]  Acute  Rehab Pager      [phone]

## 2013-12-15 NOTE — Progress Notes (Signed)
[place], RN, BSN, CCM  [phone]  Chart Reviewed for discharge and hospital needs.  Discharge needs at time of review: None present will follow for needs.  Review of patient progress due on [id number]

## 2013-12-15 NOTE — Discharge Instructions (Addendum)
° °Dr. Frank Aluisio °Total Joint Specialist °[facility] °[address] °[place], [place] [address] °[phone] ° °TOTAL KNEE REPLACEMENT POSTOPERATIVE DIRECTIONS ° ° ° °Knee Rehabilitation, Guidelines Following Surgery  °Results after knee surgery are often greatly improved when you follow the exercise, range of motion and muscle strengthening exercises prescribed by your doctor. Safety measures are also important to protect the knee from further injury. Any time any of these exercises cause you to have increased pain or swelling in your knee joint, decrease the amount until you are comfortable again and slowly increase them. If you have problems or questions, call your caregiver or physical therapist for advice.  ° °HOME CARE INSTRUCTIONS  °Remove items at home which could result in a fall. This includes throw rugs or furniture in walking pathways.  °Continue medications as instructed at time of discharge. °You may have some home medications which will be placed on hold until you complete the course of blood thinner medication.  °You may start showering once you are discharged home but do not submerge the incision under water. Just pat the incision dry and apply a dry gauze dressing on daily. °Walk with walker as instructed.  °You may resume a sexual relationship in one month or when given the OK by  your doctor.  °· Use walker as long as suggested by your caregivers. °· Avoid periods of inactivity such as sitting longer than an hour when not asleep. This helps prevent blood clots.  °You may put full weight on your legs and walk as much as is comfortable.  °You may return to work once you are cleared by your doctor.  °Do not drive a car for 6 weeks or until released by you surgeon.  °· Do not drive while taking narcotics.  °Wear the elastic stockings for three weeks following surgery during the day but you may remove then at night. °Make sure you keep all of your appointments after your  operation with all of your doctors and caregivers. You should call the office at the above phone number and make an appointment for approximately two weeks after the date of your surgery. °Change the dressing daily and reapply a dry dressing each time. °Please pick up a stool softener and laxative for home use as long as you are requiring pain medications. °· Continue to use ice on the knee for pain and swelling from surgery. You may notice swelling that will progress down to the foot and ankle.  This is normal after surgery.  Elevate the leg when you are not up walking on it.   °It is important for you to complete the blood thinner medication as prescribed by your doctor. °· Continue to use the breathing machine which will help keep your temperature down.  It is common for your temperature to cycle up and down following surgery, especially at night when you are not up moving around and exerting yourself.  The breathing machine keeps your lungs expanded and your temperature down. ° °RANGE OF MOTION AND STRENGTHENING EXERCISES  °Rehabilitation of the knee is important following a knee injury or an operation. After just a few days of immobilization, the muscles of the thigh which control the knee become weakened and shrink (atrophy). Knee exercises are designed to build up the tone and strength of the thigh muscles and to improve knee motion. Often times heat used for twenty to thirty minutes before working out will loosen up your tissues and help with improving the   range of motion but do not use heat for the first two weeks following surgery. These exercises can be done on a training (exercise) mat, on the floor, on a table or on a bed. Use what ever works the best and is most comfortable for you Knee exercises include:  Leg Lifts - While your knee is still immobilized in a splint or cast, you can do straight leg raises. Lift the leg to 60 degrees, hold for 3 sec, and slowly lower the leg. Repeat 10-20 times 2-3  times daily. Perform this exercise against resistance later as your knee gets better.  Quad and Hamstring Sets - Tighten up the muscle on the front of the thigh (Quad) and hold for 5-10 sec. Repeat this 10-20 times hourly. Hamstring sets are done by pushing the foot backward against an object and holding for 5-10 sec. Repeat as with quad sets.  A rehabilitation program following serious knee injuries can speed recovery and prevent re-injury in the future due to weakened muscles. Contact your doctor or a physical therapist for more information on knee rehabilitation.   SKILLED REHAB INSTRUCTIONS: If the patient is transferred to a skilled rehab facility following release from the hospital, a list of the current medications will be sent to the facility for the patient to continue.  When discharged from the skilled rehab facility, please have the facility set up the patient's [place] prior to being released. Also, the skilled facility will be responsible for providing the patient with their medications at time of release from the facility to include their pain medication, the muscle relaxants, and their blood thinner medication. If the patient is still at the rehab facility at time of the two week follow up appointment, the skilled rehab facility will also need to assist the patient in arranging follow up appointment in our office and any transportation needs.  MAKE SURE YOU:  Understand these instructions.  Will watch your condition.  Will get help right away if you are not doing well or get worse.    Pick up stool softner and laxative for home. Do not submerge incision under water. May shower. Continue to use ice for pain and swelling from surgery.  Patient may resume his home dosing of Xarelto 20 mg daily starting Thursday 12/17/2013    Information on my medicine - XARELTO (Rivaroxaban)  This medication education was reviewed with me or my healthcare representative as part  of my discharge preparation.  The pharmacist that spoke with me during my hospital stay was:  Clovis Riley, [facility]  Why was Xarelto prescribed for you? Xarelto was prescribed for you to reduce the risk of a blood clot forming that can cause a stroke if you have a medical condition called atrial fibrillation (a type of irregular heartbeat). Also, Xarelto was prescribed for you to reduce the risk of blood clots forming after orthopedic surgery. The medical term for these abnormal blood clots is venous thromboembolism (VTE).  What do you need to know about xarelto ? Take your Xarelto ONCE DAILY at the same time every day with your evening meal. If you have difficulty swallowing the tablet whole, you may crush it and mix in applesauce just prior to taking your dose.  Take Xarelto exactly as prescribed by your doctor and DO NOT stop taking Xarelto without talking to the doctor who prescribed the medication.  Stopping without other stroke prevention medication to take the place of Xarelto may increase your risk of developing  a clot that causes a stroke.  Refill your prescription before you run out.  After discharge, you should have regular check-up appointments with your healthcare provider that is prescribing your Xarelto.  In the future your dose may need to be changed if your kidney function or weight changes by a significant amount.  What do you do if you miss a dose? If you are taking Xarelto ONCE DAILY and you miss a dose, take it as soon as you remember on the same day then continue your regularly scheduled once daily regimen the next day. Do not take two doses of Xarelto at the same time or on the same day.   Important Safety Information A possible side effect of Xarelto is bleeding. You should call your healthcare provider right away if you experience any of the following:   Bleeding from an injury or your nose that does not stop.   Unusual colored urine (red or dark brown) or  unusual colored stools (red or black).   Unusual bruising for unknown reasons.   A serious fall or if you hit your head (even if there is no bleeding).  Some medicines may interact with Xarelto and might increase your risk of bleeding while on Xarelto. To help avoid this, consult your healthcare provider or pharmacist prior to using any new prescription or non-prescription medications, including herbals, vitamins, non-steroidal anti-inflammatory drugs (NSAIDs) and supplements.  This website has more information on Xarelto: [email or web address]

## 2013-12-15 NOTE — Progress Notes (Signed)
Physical Therapy Treatment Patient Details Name: Jerry Chase MRN: [id number] DOB: 1935-03-14 Today's Date: 12/15/2013    History of Present Illness s/p L TKA;    PT Comments    POD # 1 pm session.  Performed TKR TE's while in bed.  Applied KI then assisted pt OOB to amb in hallway with spouse.  Pt stated he was feeling "better".  Plans to D/C to home tomorrow after PT session to practice a flight of stairs.   Follow Up Recommendations  Home health PT     Equipment Recommendations  None recommended by PT    Recommendations for Other Services       Precautions / Restrictions Precautions Precautions: Fall;Knee Precaution Comments: Instructed pt on KI use for amb Required Braces or Orthoses: Knee Immobilizer - Left Knee Immobilizer - Left: Discontinue once straight leg raise with < 10 degree lag Restrictions Weight Bearing Restrictions: No Other Position/Activity Restrictions: WBAT    Mobility  Bed Mobility Overal bed mobility: Needs Assistance Bed Mobility: Supine to Sit;Sit to Supine     Supine to sit: Min assist Sit to supine: Min assist   General bed mobility comments: min assist to support L LE off/on  bed plus increased time  Transfers Overall transfer level: Needs assistance Equipment used: Rolling walker (2 wheeled) Transfers: Sit to/from Stand Sit to Stand: Min assist         General transfer comment: cues for hand placement, and wt shift  Ambulation/Gait Ambulation/Gait assistance: Min assist Ambulation Distance (Feet): 75 Feet Assistive device: Rolling walker (2 wheeled) Gait Pattern/deviations: Step-to pattern;Decreased step length - left Gait velocity: decreased   General Gait Details: amb with spouse.  25% VC's on safety with turns.  Increased time.  NO c/o dizziness.  "Feeling better"   Stairs            Wheelchair Mobility    Modified Rankin (Stroke Patients Only)       Balance                                    Cognition                            Exercises   Total Knee Replacement TE's 10 reps knee presses 10 reps heel slides  10 reps SLR's 10 reps ABD Followed by ICE    General Comments        Pertinent Vitals/Pain C/o "soreness' ICE applied    Home Living                      Prior Function            PT Goals (current goals can now be found in the care plan section) Progress towards PT goals: Progressing toward goals    Frequency  7X/week    PT Plan      Co-evaluation             End of Session Equipment Utilized During Treatment: Gait belt Activity Tolerance: Patient tolerated treatment well Patient left: in bed;with call bell/phone within reach;with family/visitor present     Time: 1430-1455 PT Time Calculation (min): 25 min  Charges:  $Gait Training: 8-22 mins $Therapeutic Exercise: 8-22 mins                     G Codes:  Rica Koyanagi  PTA [facility]  Acute  Rehab Pager      [phone]

## 2013-12-15 NOTE — Progress Notes (Signed)
   Subjective: 1 Day Post-Op Procedure(s) (LRB): LEFT TOTAL KNEE ARTHROPLASTY (Left) Patient reports pain as mild.   Patient seen in rounds with Dr. Wynelle Link. Wife in room at bedside.   Patient is well, but has had some minor complaints of pain in the knee, requiring pain medications We resume therapy today. He is already able to do SLR's.  He walked over 30 feet yesterday. Plan is to go Home after hospital stay.  Objective: Vital signs in last 24 hours: Temp:  [97.6 F (36.4 C)-98.5 F (36.9 C)] 97.7 F (36.5 C) (06/02 0545) Pulse Rate:  [65-77] 67 (06/02 0545) Resp:  [14-20] 16 (06/02 0545) BP: (132-177)/(62-109) 150/64 mmHg (06/02 0545) SpO2:  [97 %-100 %] 100 % (06/02 0545) Weight:  [89.812 kg (198 lb)] 89.812 kg (198 lb) (06/01 1145)  Intake/Output from previous day:  Intake/Output Summary (Last 24 hours) at 12/15/13 0818 Last data filed at 12/15/13 0746  Gross per 24 hour  Intake 4091.67 ml  Output   3070 ml  Net 1021.67 ml    Intake/Output this shift: Total I/O In: 90 [P.O.:90] Out: -   Labs:  Recent Labs  12/15/13 0534  HGB 11.5*    Recent Labs  12/15/13 0534  WBC 20.6*  RBC 3.85*  HCT 35.1*  PLT 278    Recent Labs  12/15/13 0534  NA 139  K 4.7  CL 103  CO2 25  BUN 17  CREATININE 0.88  GLUCOSE 132*  CALCIUM 8.5    Recent Labs  12/14/13 0640  INR 0.93    EXAM General - Patient is Alert, Appropriate and Oriented Extremity - Neurovascular intact Sensation intact distally Dorsiflexion/Plantar flexion intact Dressing - dressing C/D/I Motor Function - intact, moving foot and toes well on exam.  Hemovac pulled without difficulty.  Past Medical History  Diagnosis Date  . Hypertension   . Hyperlipidemia   . Asthma   . Arthritis   . Dysrhythmia     Assessment/Plan: 1 Day Post-Op Procedure(s) (LRB): LEFT TOTAL KNEE ARTHROPLASTY (Left) Principal Problem:   OA (osteoarthritis) of knee Active Problems:   Essential hypertension,  benign   Other and unspecified hyperlipidemia   Atrial fibrillation  Estimated body mass index is 29.23 kg/(m^2) as calculated from the following:   Height as of this encounter: 5\' 9"  (1.753 m).   Weight as of this encounter: 89.812 kg (198 lb). Advance diet Up with therapy Plan for discharge tomorrow Discharge home with home health  DVT Prophylaxis - Xarelto 10 mg daily while in hospital and then resume the 20 mg at home if no bleeding issues. Weight-Bearing as tolerated to left leg D/C O2 and Pulse OX and try on Room Air  Please note that Dr. Wynelle Link briefly discussed with the patient and his wife the importance of scheduling his follow up with Urology on an outpatient basis.  Dr. Junious Silk to follow up with patient also during this hospitalization.  Arlee Muslim, PA-C Orthopaedic Surgery 12/15/2013, 8:18 AM

## 2013-12-16 LAB — BASIC METABOLIC PANEL
BUN: 20 mg/dL (ref 6–23)
CHLORIDE: 101 meq/L (ref 96–112)
CO2: 27 mEq/L (ref 19–32)
CREATININE: 0.91 mg/dL (ref 0.50–1.35)
Calcium: 8.6 mg/dL (ref 8.4–10.5)
GFR calc Af Amer: >90 mL/min (ref >90)
GFR, EST NON AFRICAN AMERICAN: 80 mL/min — AB (ref >90)
Glucose, Bld: 128 mg/dL — ABNORMAL HIGH (ref 70–99)
Potassium: 4.2 mEq/L (ref 3.7–5.3)
Sodium: 137 mEq/L (ref 137–147)

## 2013-12-16 LAB — CBC
HCT: 32.1 % — ABNORMAL LOW (ref 39.0–52.0)
Hemoglobin: 10.6 g/dL — ABNORMAL LOW (ref 13.0–17.0)
MCH: 30.4 pg (ref 26.0–34.0)
MCHC: 33 g/dL (ref 30.0–36.0)
MCV: 92 fL (ref 78.0–100.0)
PLATELETS: 278 10*3/uL (ref 150–400)
RBC: 3.49 MIL/uL — ABNORMAL LOW (ref 4.22–5.81)
RDW: 13.9 % (ref 11.5–15.5)
WBC: 20.2 10*3/uL — AB (ref 4.0–10.5)

## 2013-12-16 MED ORDER — OXYCODONE HCL 5 MG PO TABS: 5.0000 mg | ORAL_TABLET | ORAL | Status: DC | PRN

## 2013-12-16 MED ORDER — METHOCARBAMOL 500 MG PO TABS: 500.0000 mg | ORAL_TABLET | Freq: Four times a day (QID) | ORAL | Status: DC | PRN

## 2013-12-16 MED ORDER — TRAMADOL HCL 50 MG PO TABS: 50.0000 mg | ORAL_TABLET | Freq: Four times a day (QID) | ORAL | Status: DC | PRN

## 2013-12-16 NOTE — Progress Notes (Signed)
Physical Therapy Treatment Patient Details Name: Jerry Chase MRN: [id number] DOB: 03/10/1935 Today's Date: 12/16/2013    History of Present Illness s/p L TKA;    PT Comments    POD # 2 am session pt eager to D/C to home.  Pt was able to perform an active SLR so instructed to D/C KI for amb however to wear for stairs.  Assisted pt OOB to amb in hallway with family and practiced a flight of steps.  Pt tolerated well.  Crutch sized and issued.  Handout on HEP given.    Follow Up Recommendations  Home health PT     Equipment Recommendations  None recommended by PT    Recommendations for Other Services       Precautions / Restrictions Precautions Precautions: Fall;Knee Precaution Comments: Instructed pt on KI use for stairs as pt was able to perform active SLR Required Braces or Orthoses: Knee Immobilizer - Left Knee Immobilizer - Left: Discontinue once straight leg raise with < 10 degree lag Restrictions Weight Bearing Restrictions: No Other Position/Activity Restrictions: WBAT    Mobility  Bed Mobility Overal bed mobility: Needs Assistance Bed Mobility: Supine to Sit     Supine to sit: Supervision Sit to supine: Min assist   General bed mobility comments: supervision assist to support L LE off/on  bed plus increased time  Transfers Overall transfer level: Needs assistance Equipment used: Rolling walker (2 wheeled) Transfers: Sit to/from Stand Sit to Stand: Supervision         General transfer comment: one cue for hand placement, and wt shift  Ambulation/Gait Ambulation/Gait assistance: Min guard;Supervision Ambulation Distance (Feet): 85 Feet Assistive device: Rolling walker (2 wheeled) Gait Pattern/deviations: Step-to pattern Gait velocity: decreased   General Gait Details:  25% VC's on safety with turns.     Stairs Stairs: Yes Stairs assistance: Min assist Stair Management: One rail Left;Forwards;Step to pattern;With crutches Number of Stairs:  12 General stair comments: with spouse and daughter practiced a flight of steps using one crutch and one rail.  Instructed to wear KI for extra support.  Performed well with increased time and 50% VC's on proper tech.   Wheelchair Mobility    Modified Rankin (Stroke Patients Only)       Balance                                    Cognition Arousal/Alertness: Awake/alert Behavior During Therapy: WFL for tasks assessed/performed Overall Cognitive Status: Within Functional Limits for tasks assessed                      Exercises      General Comments        Pertinent Vitals/Pain C/o "stiffness"    Home Living Family/patient expects to be discharged to:: Private residence Living Arrangements: Spouse/significant other Available Help at Discharge: Family Type of Home: House Home Access: Level entry   Home Layout: One level Home Equipment: Environmental consultant - 2 wheels Additional Comments: pt plans to stay downstairs in basement/ no stairs initially    Prior Function Level of Independence: Independent          PT Goals (current goals can now be found in the care plan section) Acute Rehab PT Goals Patient Stated Goal: get back to doing every thing Progress towards PT goals: Progressing toward goals    Frequency       PT Plan  Co-evaluation             End of Session Equipment Utilized During Treatment: Gait belt Activity Tolerance: Patient tolerated treatment well Patient left: in chair;with call bell/phone within reach;with family/visitor present     Time: 1025-1050 PT Time Calculation (min): 25 min  Charges:  $Gait Training: 8-22 mins $Self Care/Home Management: 8-22                    G Codes:      Rica Koyanagi  PTA [facility]  Acute  Rehab Pager      [phone]

## 2013-12-16 NOTE — Evaluation (Signed)
Occupational Therapy Evaluation Patient Details Name: Jerry Chase MRN: [id number] DOB: 1935/06/10 Today's Date: 12/16/2013    History of Present Illness s/p L TKA;   Clinical Impression   Education complete regarding ADL activity s/p TKR.    Follow Up Recommendations  No OT follow up          Precautions / Restrictions Precautions Precautions: Fall;Knee Precaution Comments: Instructed pt on KI use for amb Required Braces or Orthoses: Knee Immobilizer - Left Knee Immobilizer - Left: Discontinue once straight leg raise with < 10 degree lag Restrictions Weight Bearing Restrictions: No Other Position/Activity Restrictions: WBAT      Mobility Bed Mobility Overal bed mobility: Needs Assistance Bed Mobility: Supine to Sit;Sit to Supine     Supine to sit: Min assist Sit to supine: Min assist   General bed mobility comments: min assist to support L LE off/on  bed plus increased time  Transfers Overall transfer level: Needs assistance Equipment used: Rolling walker (2 wheeled) Transfers: Sit to/from Stand Sit to Stand: Supervision         General transfer comment: cues for hand placement, and wt shift         ADL                                         General ADL Comments: Educated on ADL activity s/p TKR. Educated on shower transfer, toilet transfer, dressing, and walker safety.                  Extremity/Trunk Assessment Upper Extremity Assessment Upper Extremity Assessment: Overall WFL for tasks assessed           Communication Communication Communication: No difficulties   Cognition Arousal/Alertness: Awake/alert Behavior During Therapy: WFL for tasks assessed/performed Overall Cognitive Status: Within Functional Limits for tasks assessed                     General Comments       Exercises       Shoulder Instructions      Home Living Family/patient expects to be discharged to:: Private residence Living  Arrangements: Spouse/significant other Available Help at Discharge: Family Type of Home: House Home Access: Level entry     Home Layout: One level Alternate Level Stairs-Number of Steps: will stay basement level             Home Equipment: Walker - 2 wheels   Additional Comments: pt plans to stay downstairs in basement/ no stairs initially      Prior Functioning/Environment Level of Independence: Independent                      OT Goals(Current goals can be found in the care plan section) Acute Rehab OT Goals Patient Stated Goal: get back to doing every thing OT Goal Formulation: With patient  OT Frequency:             Co-evaluation              End of Session    Activity Tolerance: Patient tolerated treatment well Patient left: in chair;with call bell/phone within reach;with family/visitor present   Time: 1050-1110 OT Time Calculation (min): 20 min Charges:  OT General Charges $OT Visit: 1 Procedure OT Evaluation $Initial OT Evaluation Tier I: 1 Procedure OT Treatments $Self Care/Home Management : 8-22 mins G-Codes:  Betsy Pries 12/16/2013, 12:15 PM

## 2013-12-16 NOTE — Discharge Summary (Signed)
Physician Discharge Summary   Patient ID: AMIERE Jerry Chase MRN: [id number] DOB/AGE: 1934-11-17 79 y.o.  Admit date: 12/14/2013 Discharge date: 12/16/2013  Primary Diagnosis:  Osteoarthritis Left knee(s)  Admission Diagnoses:  Past Medical History  Diagnosis Date  . Hypertension   . Hyperlipidemia   . Asthma   . Arthritis   . Dysrhythmia    Discharge Diagnoses:   Principal Problem:   OA (osteoarthritis) of knee Active Problems:   Essential hypertension, benign   Other and unspecified hyperlipidemia   Atrial fibrillation  Estimated body mass index is 29.23 kg/(m^2) as calculated from the following:   Height as of this encounter: 5' 9"  (1.753 m).   Weight as of this encounter: 89.812 kg (198 lb).  Procedure:  Procedure(s) (LRB): LEFT TOTAL KNEE ARTHROPLASTY (Left)   Consults: urology, Dr. Junious Silk  HPI: Jerry Chase is a 79 y.o. year old male with end stage OA of his left knee with progressively worsening pain and dysfunction. He has constant pain, with activity and at rest and significant functional deficits with difficulties even with ADLs. He has had extensive non-op management including analgesics, injections of cortisone and viscosupplements, and home exercise program, but remains in significant pain with significant dysfunction. Radiographs show bone on bone arthritis medial and patellofemoral. He presents now for left Total Knee Arthroplasty.   Laboratory Data: Admission on 12/14/2013, Discharged on 12/16/2013  Component Date Value Ref Range Status  . Prothrombin Time 12/14/2013 12.3  11.6 - 15.2 seconds Final  . INR 12/14/2013 0.93  0.00 - 1.49 Final  . WBC 12/15/2013 20.6* 4.0 - 10.5 K/uL Final  . RBC 12/15/2013 3.85* 4.22 - 5.81 MIL/uL Final  . Hemoglobin 12/15/2013 11.5* 13.0 - 17.0 g/dL Final  . HCT 12/15/2013 35.1* 39.0 - 52.0 % Final  . MCV 12/15/2013 91.2  78.0 - 100.0 fL Final  . MCH 12/15/2013 29.9  26.0 - 34.0 pg Final  . MCHC 12/15/2013 32.8  30.0 -  36.0 g/dL Final  . RDW 12/15/2013 13.8  11.5 - 15.5 % Final  . Platelets 12/15/2013 278  150 - 400 K/uL Final  . Sodium 12/15/2013 139  137 - 147 mEq/L Final  . Potassium 12/15/2013 4.7  3.7 - 5.3 mEq/L Final  . Chloride 12/15/2013 103  96 - 112 mEq/L Final  . CO2 12/15/2013 25  19 - 32 mEq/L Final  . Glucose, Bld 12/15/2013 132* 70 - 99 mg/dL Final  . BUN 12/15/2013 17  6 - 23 mg/dL Final  . Creatinine, Ser 12/15/2013 0.88  0.50 - 1.35 mg/dL Final  . Calcium 12/15/2013 8.5  8.4 - 10.5 mg/dL Final  . GFR calc non Af Amer 12/15/2013 81* >90 mL/min Final  . GFR calc Af Amer 12/15/2013 >90  >90 mL/min Final   Comment: (NOTE)                          The eGFR has been calculated using the CKD EPI equation.                          This calculation has not been validated in all clinical situations.                          eGFR's persistently <90 mL/min signify possible Chronic Kidney  Disease.  . WBC 12/16/2013 20.2* 4.0 - 10.5 K/uL Final  . RBC 12/16/2013 3.49* 4.22 - 5.81 MIL/uL Final  . Hemoglobin 12/16/2013 10.6* 13.0 - 17.0 g/dL Final  . HCT 12/16/2013 32.1* 39.0 - 52.0 % Final  . MCV 12/16/2013 92.0  78.0 - 100.0 fL Final  . MCH 12/16/2013 30.4  26.0 - 34.0 pg Final  . MCHC 12/16/2013 33.0  30.0 - 36.0 g/dL Final  . RDW 12/16/2013 13.9  11.5 - 15.5 % Final  . Platelets 12/16/2013 278  150 - 400 K/uL Final  . Sodium 12/16/2013 137  137 - 147 mEq/L Final  . Potassium 12/16/2013 4.2  3.7 - 5.3 mEq/L Final  . Chloride 12/16/2013 101  96 - 112 mEq/L Final  . CO2 12/16/2013 27  19 - 32 mEq/L Final  . Glucose, Bld 12/16/2013 128* 70 - 99 mg/dL Final  . BUN 12/16/2013 20  6 - 23 mg/dL Final  . Creatinine, Ser 12/16/2013 0.91  0.50 - 1.35 mg/dL Final  . Calcium 12/16/2013 8.6  8.4 - 10.5 mg/dL Final  . GFR calc non Af Amer 12/16/2013 80* >90 mL/min Final  . GFR calc Af Amer 12/16/2013 >90  >90 mL/min Final   Comment: (NOTE)                          The eGFR has  been calculated using the CKD EPI equation.                          This calculation has not been validated in all clinical situations.                          eGFR's persistently <90 mL/min signify possible Chronic Kidney                          Disease.  Hospital Outpatient Visit on 12/09/2013  Component Date Value Ref Range Status  . aPTT 12/09/2013 36  24 - 37 seconds Final  . WBC 12/09/2013 10.3  4.0 - 10.5 K/uL Final  . RBC 12/09/2013 4.71  4.22 - 5.81 MIL/uL Final  . Hemoglobin 12/09/2013 14.0  13.0 - 17.0 g/dL Final  . HCT 12/09/2013 42.9  39.0 - 52.0 % Final  . MCV 12/09/2013 91.1  78.0 - 100.0 fL Final  . MCH 12/09/2013 29.7  26.0 - 34.0 pg Final  . MCHC 12/09/2013 32.6  30.0 - 36.0 g/dL Final  . RDW 12/09/2013 13.5  11.5 - 15.5 % Final  . Platelets 12/09/2013 292  150 - 400 K/uL Final  . Sodium 12/09/2013 139  137 - 147 mEq/L Final  . Potassium 12/09/2013 4.1  3.7 - 5.3 mEq/L Final  . Chloride 12/09/2013 100  96 - 112 mEq/L Final  . CO2 12/09/2013 26  19 - 32 mEq/L Final  . Glucose, Bld 12/09/2013 102* 70 - 99 mg/dL Final  . BUN 12/09/2013 29* 6 - 23 mg/dL Final  . Creatinine, Ser 12/09/2013 1.14  0.50 - 1.35 mg/dL Final  . Calcium 12/09/2013 9.4  8.4 - 10.5 mg/dL Final  . Total Protein 12/09/2013 7.1  6.0 - 8.3 g/dL Final  . Albumin 12/09/2013 3.6  3.5 - 5.2 g/dL Final  . AST 12/09/2013 28  0 - 37 U/L Final  . ALT 12/09/2013 44  0 - 53 U/L Final  .  Alkaline Phosphatase 12/09/2013 121* 39 - 117 U/L Final  . Total Bilirubin 12/09/2013 0.6  0.3 - 1.2 mg/dL Final  . GFR calc non Af Amer 12/09/2013 60* >90 mL/min Final  . GFR calc Af Amer 12/09/2013 70* >90 mL/min Final   Comment: (NOTE)                          The eGFR has been calculated using the CKD EPI equation.                          This calculation has not been validated in all clinical situations.                          eGFR's persistently <90 mL/min signify possible Chronic Kidney                           Disease.  Marland Kitchen Prothrombin Time 12/09/2013 17.1* 11.6 - 15.2 seconds Final  . INR 12/09/2013 1.43  0.00 - 1.49 Final  . ABO/RH(D) 12/09/2013 A POS   Final  . Antibody Screen 12/09/2013 NEG   Final  . Sample Expiration 12/09/2013 12/17/2013   Final  . Color, Urine 12/09/2013 YELLOW  YELLOW Final  . APPearance 12/09/2013 CLOUDY* CLEAR Final  . Specific Gravity, Urine 12/09/2013 1.012  1.005 - 1.030 Final  . pH 12/09/2013 5.5  5.0 - 8.0 Final  . Glucose, UA 12/09/2013 NEGATIVE  NEGATIVE mg/dL Final  . Hgb urine dipstick 12/09/2013 NEGATIVE  NEGATIVE Final  . Bilirubin Urine 12/09/2013 NEGATIVE  NEGATIVE Final  . Ketones, ur 12/09/2013 NEGATIVE  NEGATIVE mg/dL Final  . Protein, ur 12/09/2013 NEGATIVE  NEGATIVE mg/dL Final  . Urobilinogen, UA 12/09/2013 0.2  0.0 - 1.0 mg/dL Final  . Nitrite 12/09/2013 NEGATIVE  NEGATIVE Final  . Leukocytes, UA 12/09/2013 SMALL* NEGATIVE Final  . MRSA, PCR 12/09/2013 NEGATIVE  NEGATIVE Final  . Staphylococcus aureus 12/09/2013 NEGATIVE  NEGATIVE Final   Comment:                                 The Xpert SA Assay (FDA                          approved for NASAL specimens                          in patients over 28 years of age),                          is one component of                          a comprehensive surveillance                          program.  Test performance has                          been validated by [employer]  [facility] for patients greater                          than or equal to 41 year old.                          It is not intended                          to diagnose infection nor to                          guide or monitor treatment.  . Squamous Epithelial / LPF 12/09/2013 FEW* RARE Final  . WBC, UA 12/09/2013 7-10  <3 WBC/hpf Final     X-Rays:No results found.  EKG: Orders placed during the hospital encounter of 12/09/13  . EKG 12-LEAD  . EKG 12-LEAD     Hospital Course: Jerry Chase is a 79  y.o. who was admitted to [facility]. They were brought to the operating room on 12/14/2013 and underwent Procedure(s): LEFT TOTAL KNEE ARTHROPLASTY.  Patient tolerated the procedure well.  Had difficulty with foley placement prior to the surgery so a Urology consult was called.  The patient was seen by Dr. Junious Silk in the operating room following the knee replacement.  The foley was placed and he and was later transferred to the recovery room and then to the orthopaedic floor for postoperative care.  They were given PO and IV analgesics for pain control following their surgery.  They were given 24 hours of postoperative antibiotics of  Anti-infectives   Start     Dose/Rate Route Frequency Ordered Stop   12/14/13 1430  ceFAZolin (ANCEF) IVPB 2 g/50 mL premix     2 g 100 mL/hr over 30 Minutes Intravenous Every 6 hours 12/14/13 1153 12/14/13 2132   12/14/13 0611  ceFAZolin (ANCEF) IVPB 2 g/50 mL premix     2 g 100 mL/hr over 30 Minutes Intravenous On call to O.R. 12/14/13 [id number] 12/14/13 0825     and started on DVT prophylaxis in the form of Xarelto 10 mg daily while in hospital and then would resume the 20 mg at home if no bleeding issues.   PT and OT were ordered for total joint protocol.  Discharge planning consulted to help with postop disposition and equipment needs.  Patient had a decent night on the evening of surgery and walked 30 feet the day of surgery.  They started to get up OOB with therapy on day one and able to do SLR's. Hemovac drain was pulled without difficulty.  Continued to work with therapy into day two.  Dressing was changed on day two and the incision was healing well. Patient was seen in rounds and was ready to go home. Please note that Dr. Wynelle Link briefly discussed with the patient and his wife the importance of scheduling his follow up with Urology on an outpatient basis. Dr. Junious Silk to follow up with patient also during this hospitalization.  Discharge home with home  health  Diet - Cardiac diet  Follow up - in 2 weeks  Activity - WBAT  Disposition - Home  Condition Upon Discharge - Good  D/C Meds - See DC Summary  DVT Prophylaxis - Xarelto 10 mg daily while in hospital and then resume the 20 mg at home if no bleeding issues.  Discharge Instructions   Call MD / Call 911    Complete by:  As directed   If you experience chest pain or shortness of breath, CALL 911 and be transported to the hospital emergency room.  If you develope a fever above 101 F, pus (white drainage) or increased drainage or redness at the wound, or calf pain, call your surgeon's office.     Change dressing    Complete by:  As directed   Change dressing daily with sterile 4 x 4 inch gauze dressing and apply TED hose. Do not submerge the incision under water.     Constipation Prevention    Complete by:  As directed   Drink plenty of fluids.  Prune juice may be helpful.  You may use a stool softener, such as Colace (over the counter) 100 mg twice a day.  Use MiraLax (over the counter) for constipation as needed.     Diet - low sodium heart healthy    Complete by:  As directed      Discharge instructions    Complete by:  As directed   Pick up stool softner and laxative for home. Do not submerge incision under water. May shower. Continue to use ice for pain and swelling from surgery.  May resume his home dosing of Xarelto 20 mg daily starting Thursday 12/17/2013     Do not put a pillow under the knee. Place it under the heel.    Complete by:  As directed      Do not sit on low chairs, stoools or toilet seats, as it may be difficult to get up from low surfaces    Complete by:  As directed      Driving restrictions    Complete by:  As directed   No driving until released by the physician.     Increase activity slowly as tolerated    Complete by:  As directed      Lifting restrictions    Complete by:  As directed   No lifting until released by the physician.     Patient  may shower    Complete by:  As directed   You may shower without a dressing once there is no drainage.  Do not wash over the wound.  If drainage remains, do not shower until drainage stops.     TED hose    Complete by:  As directed   Use stockings (TED hose) for 3 weeks on both leg(s).  You may remove them at night for sleeping.     Weight bearing as tolerated    Complete by:  As directed             Medication List    STOP taking these medications       Vitamin D (Ergocalciferol) 50000 UNITS Caps capsule  Commonly known as:  DRISDOL      TAKE these medications       amiodarone 200 MG tablet  Commonly known as:  PACERONE  Take 200 mg by mouth every morning.     COMBIVENT RESPIMAT 20-100 MCG/ACT Aers respimat  Generic drug:  Ipratropium-Albuterol  Inhale 1 puff into the lungs 4 (four) times daily.     digoxin 0.25 MG tablet  Commonly known as:  LANOXIN  Take 0.25 mg by mouth every morning.     Fluticasone-Salmeterol 250-50 MCG/DOSE Aepb  Commonly known as:  ADVAIR  Inhale 1 puff into the lungs 2 (two) times daily.  methocarbamol 500 MG tablet  Commonly known as:  ROBAXIN  Take 1 tablet (500 mg total) by mouth every 6 (six) hours as needed for muscle spasms.     metoprolol 50 MG tablet  Commonly known as:  LOPRESSOR  Take 25 mg by mouth 2 (two) times daily.     oxyCODONE 5 MG immediate release tablet  Commonly known as:  Oxy IR/ROXICODONE  Take 1-2 tablets (5-10 mg total) by mouth every 3 (three) hours as needed for moderate pain, severe pain or breakthrough pain.     pravastatin 80 MG tablet  Commonly known as:  PRAVACHOL  Take 80 mg by mouth every morning.     spironolactone 25 MG tablet  Commonly known as:  ALDACTONE  Take 25 mg by mouth every morning.     traMADol 50 MG tablet  Commonly known as:  ULTRAM  Take 1-2 tablets (50-100 mg total) by mouth every 6 (six) hours as needed (mild to moderate pain).     valsartan-hydrochlorothiazide 160-25 MG per  tablet  Commonly known as:  DIOVAN-HCT  Take 1 tablet by mouth every evening.     XARELTO 20 MG Tabs tablet  Generic drug:  rivaroxaban  Take 20 mg by mouth daily with supper.       Follow-up Information   Schedule an appointment as soon as possible for a visit with [name], MD.   Specialty:  Urology   Contact information:   [place] [place] [address] [phone]       Follow up with Gearlean Alf, MD. Schedule an appointment as soon as possible for a visit in 2 weeks.   Specialty:  Orthopedic Surgery   Contact information:   [address] [place] [address] [phone]       Signed: Arlee Muslim, PA-C Orthopaedic Surgery 12/27/2013, 12:56 PM

## 2013-12-16 NOTE — Progress Notes (Signed)
   Subjective: 2 Days Post-Op Procedure(s) (LRB): LEFT TOTAL KNEE ARTHROPLASTY (Left) Patient reports pain as mild.   Patient seen in rounds with Dr. Wynelle Link.  Wife in room. Patient is well, and has had no acute complaints or problems Patient is ready to go home  Objective: Vital signs in last 24 hours: Temp:  [98 F (36.7 C)-98.8 F (37.1 C)] 98.8 F (37.1 C) (06/03 0548) Pulse Rate:  [65-70] 69 (06/03 0548) Resp:  [16] 16 (06/03 0548) BP: (130-154)/(61-78) 145/68 mmHg (06/03 0548) SpO2:  [94 %-98 %] 94 % (06/03 0548)  Intake/Output from previous day:  Intake/Output Summary (Last 24 hours) at 12/16/13 0707 Last data filed at 12/16/13 0423  Gross per 24 hour  Intake 1218.67 ml  Output   1300 ml  Net -81.33 ml    Intake/Output this shift:    Labs:  Recent Labs  12/15/13 0534 12/16/13 0438  HGB 11.5* 10.6*    Recent Labs  12/15/13 0534 12/16/13 0438  WBC 20.6* 20.2*  RBC 3.85* 3.49*  HCT 35.1* 32.1*  PLT 278 278    Recent Labs  12/15/13 0534 12/16/13 0438  NA 139 137  K 4.7 4.2  CL 103 101  CO2 25 27  BUN 17 20  CREATININE 0.88 0.91  GLUCOSE 132* 128*  CALCIUM 8.5 8.6    Recent Labs  12/14/13 0640  INR 0.93    EXAM: General - Patient is Alert, Appropriate and Oriented Extremity - Neurovascular intact Sensation intact distally Dorsiflexion/Plantar flexion intact Incision - clean, dry, no drainage, healing Motor Function - intact, moving foot and toes well on exam.   Assessment/Plan: 2 Days Post-Op Procedure(s) (LRB): LEFT TOTAL KNEE ARTHROPLASTY (Left) Procedure(s) (LRB): LEFT TOTAL KNEE ARTHROPLASTY (Left) Past Medical History  Diagnosis Date  . Hypertension   . Hyperlipidemia   . Asthma   . Arthritis   . Dysrhythmia    Principal Problem:   OA (osteoarthritis) of knee Active Problems:   Essential hypertension, benign   Other and unspecified hyperlipidemia   Atrial fibrillation  Estimated body mass index is 29.23 kg/(m^2)  as calculated from the following:   Height as of this encounter: 5\' 9"  (1.753 m).   Weight as of this encounter: 89.812 kg (198 lb). Up with therapy Discharge home with home health Diet - Cardiac diet Follow up - in 2 weeks Activity - WBAT Disposition - Home Condition Upon Discharge - Good D/C Meds - See DC Summary DVT Prophylaxis - Xarelto 10 mg daily while in hospital and then resume the 20 mg at home if no bleeding issues.  Please note that Dr. Wynelle Link briefly discussed with the patient and his wife the importance of scheduling his follow up with Urology on an outpatient basis. Dr. Junious Silk to follow up with patient also during this hospitalization.  Arlee Muslim, PA-C Orthopaedic Surgery 12/16/2013, 7:07 AM

## 2014-01-07 ENCOUNTER — Other Ambulatory Visit: Payer: Self-pay | Admitting: Urology

## 2014-02-02 ENCOUNTER — Encounter (HOSPITAL_COMMUNITY): Payer: Self-pay | Admitting: Pharmacy Technician

## 2014-02-04 HISTORY — PX: DOPPLER ECHOCARDIOGRAPHY: SHX263

## 2014-02-08 ENCOUNTER — Encounter (HOSPITAL_COMMUNITY): Payer: Self-pay

## 2014-02-08 ENCOUNTER — Encounter (HOSPITAL_COMMUNITY)
Admission: RE | Admit: 2014-02-08 | Discharge: 2014-02-08 | Disposition: A | Discharge status: Home / Self Care | Payer: Medicare Other | Source: Ambulatory Visit | Attending: Urology | Admitting: Urology

## 2014-02-08 DIAGNOSIS — Z01818 Encounter for other preprocedural examination: Secondary | ICD-10-CM | POA: Diagnosis present

## 2014-02-08 DIAGNOSIS — Z01812 Encounter for preprocedural laboratory examination: Secondary | ICD-10-CM | POA: Insufficient documentation

## 2014-02-08 HISTORY — DX: Nocturia: R35.1

## 2014-02-08 HISTORY — DX: Shortness of breath: R06.02

## 2014-02-08 HISTORY — DX: Chronic obstructive pulmonary disease, unspecified: J44.9

## 2014-02-08 LAB — BASIC METABOLIC PANEL
ANION GAP: 14 (ref 5–15)
BUN: 26 mg/dL — AB (ref 6–23)
CALCIUM: 9.2 mg/dL (ref 8.4–10.5)
CO2: 23 mEq/L (ref 19–32)
Chloride: 102 mEq/L (ref 96–112)
Creatinine, Ser: 1.1 mg/dL (ref 0.50–1.35)
GFR calc Af Amer: 73 mL/min — ABNORMAL LOW (ref >90)
GFR, EST NON AFRICAN AMERICAN: 63 mL/min — AB (ref >90)
Glucose, Bld: 96 mg/dL (ref 70–99)
Potassium: 4.5 mEq/L (ref 3.7–5.3)
Sodium: 139 mEq/L (ref 137–147)

## 2014-02-08 LAB — CBC
HEMATOCRIT: 32.3 % — AB (ref 39.0–52.0)
Hemoglobin: 10.4 g/dL — ABNORMAL LOW (ref 13.0–17.0)
MCH: 29.6 pg (ref 26.0–34.0)
MCHC: 32.2 g/dL (ref 30.0–36.0)
MCV: 92 fL (ref 78.0–100.0)
Platelets: 432 10*3/uL — ABNORMAL HIGH (ref 150–400)
RBC: 3.51 MIL/uL — ABNORMAL LOW (ref 4.22–5.81)
RDW: 13.8 % (ref 11.5–15.5)
WBC: 10.9 10*3/uL — AB (ref 4.0–10.5)

## 2014-02-08 NOTE — Progress Notes (Addendum)
Chest x-ray 08/24/13 on EPIC, EKG 12/09/13 on EPIC, ECHO 08/31/13 on chart

## 2014-02-08 NOTE — Patient Instructions (Addendum)
[place]  02/08/2014   Your procedure is scheduled on: Friday 02/12/14  Report to [facility] at 05:30 AM.  Call this number if you have problems the morning of surgery [phone]: [phone]   Remember: please bring inhaler on day of surgery    Do not eat food or drink liquids After Midnight.     Take these medicines the morning of surgery with A SIP OF WATER: amiodarone, digoxin, inhaler if needed, metoprolol, pravastatin   Do not wear jewelry, make-up or nail polish.  Do not wear lotions, powders, or perfumes. You may wear deodorant.  Do not shave 48 hours prior to surgery. Men may shave face and neck.  Do not bring valuables to the hospital.  Contacts, dentures or bridgework may not be worn into surgery.   Patients discharged the day of surgery will not be allowed to drive home.  Name and phone number of your driver: Lelan Pons (wife) [phone]   Paulette Blanch, RN  pre op nurse call if needed [phone]    [facility] - Preparing for Surgery Before surgery, you can play an important role.  Because skin is not sterile, your skin needs to be as free of germs as possible.  You can reduce the number of germs on your skin by washing with CHG (chlorahexidine gluconate) soap before surgery.  CHG is an antiseptic cleaner which kills germs and bonds with the skin to continue killing germs even after washing. Please DO NOT use if you have an allergy to CHG or antibacterial soaps.  If your skin becomes reddened/irritated stop using the CHG and inform your nurse when you arrive at Short Stay. Do not shave (including legs and underarms) for at least 48 hours prior to the first CHG shower.  You may shave your face/neck. Please follow these instructions carefully:  1.  Shower with CHG Soap the night before surgery and the  morning of Surgery.  2.  If you choose to wash your hair, wash your hair first as usual with your  normal  shampoo.  3.  After you shampoo, rinse your hair  and body thoroughly to remove the  shampoo.                            4.  Use CHG as you would any other liquid soap.  You can apply chg directly  to the skin and wash                       Gently with a scrungie or clean washcloth.  5.  Apply the CHG Soap to your body ONLY FROM THE NECK DOWN.   Do not use on face/ open                           Wound or open sores. Avoid contact with eyes, ears mouth and genitals (private parts).                       Wash face,  Genitals (private parts) with your normal soap.             6.  Wash thoroughly, paying special attention to the area where your surgery  will be performed.  7.  Thoroughly rinse your body with warm water from the neck down.  8.  DO NOT shower/wash with your  normal soap after using and rinsing off  the CHG Soap.                9.  Pat yourself dry with a clean towel.            10.  Wear clean pajamas.            11.  Place clean sheets on your bed the night of your first shower and do not  sleep with pets. Day of Surgery : Do not apply any lotions/deodorants the morning of surgery.  Please wear clean clothes to the hospital/surgery center.  FAILURE TO FOLLOW THESE INSTRUCTIONS MAY RESULT IN THE CANCELLATION OF YOUR SURGERY PATIENT SIGNATURE_________________________________  NURSE SIGNATURE__________________________________  ________________________________________________________________________

## 2014-02-08 NOTE — Progress Notes (Signed)
02/08/14 0826  OBSTRUCTIVE SLEEP APNEA  Have you ever been diagnosed with sleep apnea through a sleep study? No  Do you snore loudly (loud enough to be heard through closed doors)?  1  Do you often feel tired, fatigued, or sleepy during the daytime? 0  Has anyone observed you stop breathing during your sleep? 0  Do you have, or are you being treated for high blood pressure? 1  BMI more than 35 kg/m2? 0  Age over 77 years old? 1  Neck circumference greater than 40 cm/16 inches? 0  Gender: 1  Obstructive Sleep Apnea Score 4  Score 4 or greater  Results sent to PCP

## 2014-02-11 NOTE — Anesthesia Preprocedure Evaluation (Addendum)
Anesthesia Evaluation  Patient identified by MRN, date of birth, ID band Patient awake    Reviewed: Allergy & Precautions, H&P , NPO status , Patient's Chart, lab work & pertinent test results  Airway Mallampati: II TM Distance: >3 FB Neck ROM: Full    Dental no notable dental hx.    Pulmonary asthma , COPDformer smoker,  breath sounds clear to auscultation  Pulmonary exam normal       Cardiovascular hypertension, Pt. on medications and Pt. on home beta blockers Rhythm:Regular Rate:Normal  2 cardioversions 2015. ECG SB RBBB     Neuro/Psych negative neurological ROS  negative psych ROS   GI/Hepatic negative GI ROS, Neg liver ROS,   Endo/Other  negative endocrine ROS  Renal/GU negative Renal ROS  negative genitourinary   Musculoskeletal negative musculoskeletal ROS (+)   Abdominal   Peds negative pediatric ROS (+)  Hematology negative hematology ROS (+)   Anesthesia Other Findings   Reproductive/Obstetrics negative OB ROS                          Anesthesia Physical Anesthesia Plan  ASA: III  Anesthesia Plan: General   Post-op Pain Management:    Induction: Intravenous  Airway Management Planned: LMA  Additional Equipment:   Intra-op Plan:   Post-operative Plan: Extubation in OR  Informed Consent: I have reviewed the patients History and Physical, chart, labs and discussed the procedure including the risks, benefits and alternatives for the proposed anesthesia with the patient or authorized representative who has indicated his/her understanding and acceptance.   Dental advisory given  Plan Discussed with: CRNA and Surgeon  Anesthesia Plan Comments:         Anesthesia Quick Evaluation

## 2014-02-12 ENCOUNTER — Encounter (HOSPITAL_COMMUNITY)
Admission: RE | Disposition: A | Discharge status: Home / Self Care | Payer: Self-pay | Source: Ambulatory Visit | Attending: Urology

## 2014-02-12 ENCOUNTER — Encounter (HOSPITAL_COMMUNITY): Payer: Medicare Other | Admitting: Anesthesiology

## 2014-02-12 ENCOUNTER — Encounter (HOSPITAL_COMMUNITY): Payer: Self-pay | Admitting: *Deleted

## 2014-02-12 ENCOUNTER — Ambulatory Visit (HOSPITAL_COMMUNITY)
Admission: RE | Admit: 2014-02-12 | Discharge: 2014-02-12 | Disposition: A | Discharge status: Home / Self Care | Payer: Medicare Other | Source: Ambulatory Visit | Attending: Urology | Admitting: Urology

## 2014-02-12 ENCOUNTER — Ambulatory Visit (HOSPITAL_COMMUNITY): Payer: Medicare Other | Admitting: Anesthesiology

## 2014-02-12 DIAGNOSIS — I4891 Unspecified atrial fibrillation: Secondary | ICD-10-CM | POA: Insufficient documentation

## 2014-02-12 DIAGNOSIS — J4489 Other specified chronic obstructive pulmonary disease: Secondary | ICD-10-CM | POA: Insufficient documentation

## 2014-02-12 DIAGNOSIS — Z96659 Presence of unspecified artificial knee joint: Secondary | ICD-10-CM | POA: Insufficient documentation

## 2014-02-12 DIAGNOSIS — C600 Malignant neoplasm of prepuce: Secondary | ICD-10-CM | POA: Insufficient documentation

## 2014-02-12 DIAGNOSIS — Z7901 Long term (current) use of anticoagulants: Secondary | ICD-10-CM | POA: Diagnosis not present

## 2014-02-12 DIAGNOSIS — E7800 Pure hypercholesterolemia, unspecified: Secondary | ICD-10-CM | POA: Diagnosis not present

## 2014-02-12 DIAGNOSIS — J449 Chronic obstructive pulmonary disease, unspecified: Secondary | ICD-10-CM | POA: Insufficient documentation

## 2014-02-12 DIAGNOSIS — Z79899 Other long term (current) drug therapy: Secondary | ICD-10-CM | POA: Diagnosis not present

## 2014-02-12 DIAGNOSIS — N471 Phimosis: Secondary | ICD-10-CM | POA: Insufficient documentation

## 2014-02-12 DIAGNOSIS — D4959 Neoplasm of unspecified behavior of other genitourinary organ: Secondary | ICD-10-CM | POA: Diagnosis present

## 2014-02-12 DIAGNOSIS — N478 Other disorders of prepuce: Secondary | ICD-10-CM | POA: Insufficient documentation

## 2014-02-12 DIAGNOSIS — I10 Essential (primary) hypertension: Secondary | ICD-10-CM | POA: Diagnosis not present

## 2014-02-12 DIAGNOSIS — I499 Cardiac arrhythmia, unspecified: Secondary | ICD-10-CM | POA: Diagnosis not present

## 2014-02-12 HISTORY — PX: CIRCUMCISION: SHX1350

## 2014-02-12 SURGERY — CIRCUMCISION, ADULT
Anesthesia: General

## 2014-02-12 MED ORDER — BACITRACIN ZINC 500 UNIT/GM EX OINT
TOPICAL_OINTMENT | CUTANEOUS | Status: DC | PRN
  Administered 2014-02-12: 1 via TOPICAL

## 2014-02-12 MED ORDER — 0.9 % SODIUM CHLORIDE (POUR BTL) OPTIME
TOPICAL | Status: DC | PRN
  Administered 2014-02-12: 1000 mL

## 2014-02-12 MED ORDER — EPHEDRINE SULFATE 50 MG/ML IJ SOLN
INTRAMUSCULAR | Status: AC
  Filled 2014-02-12: qty 1

## 2014-02-12 MED ORDER — BUPIVACAINE-EPINEPHRINE (PF) 0.25% -1:200000 IJ SOLN
INTRAMUSCULAR | Status: AC
  Filled 2014-02-12: qty 30

## 2014-02-12 MED ORDER — EPHEDRINE SULFATE 50 MG/ML IJ SOLN
INTRAMUSCULAR | Status: DC | PRN
  Administered 2014-02-12: 5 mg via INTRAVENOUS

## 2014-02-12 MED ORDER — LIDOCAINE HCL (CARDIAC) 20 MG/ML IV SOLN
INTRAVENOUS | Status: AC
  Filled 2014-02-12: qty 5

## 2014-02-12 MED ORDER — FENTANYL CITRATE 0.05 MG/ML IJ SOLN
INTRAMUSCULAR | Status: DC | PRN
  Administered 2014-02-12 (×5): 50 ug via INTRAVENOUS

## 2014-02-12 MED ORDER — PROPOFOL 10 MG/ML IV BOLUS
INTRAVENOUS | Status: DC | PRN
  Administered 2014-02-12: 200 mg via INTRAVENOUS

## 2014-02-12 MED ORDER — BUPIVACAINE HCL (PF) 0.25 % IJ SOLN
INTRAMUSCULAR | Status: DC | PRN
  Administered 2014-02-12: 10 mL

## 2014-02-12 MED ORDER — OXYCODONE-ACETAMINOPHEN 5-325 MG PO TABS
1.0000 | ORAL_TABLET | ORAL | Status: AC | PRN
Start: 2014-02-12 — End: ?

## 2014-02-12 MED ORDER — HYDROMORPHONE HCL PF 2 MG/ML IJ SOLN
INTRAMUSCULAR | Status: AC
  Filled 2014-02-12: qty 1

## 2014-02-12 MED ORDER — LACTATED RINGERS IV SOLN
INTRAVENOUS | Status: DC | PRN
  Administered 2014-02-12: 07:00:00 via INTRAVENOUS

## 2014-02-12 MED ORDER — PROMETHAZINE HCL 25 MG/ML IJ SOLN: 6.2500 mg | INTRAMUSCULAR | Status: DC | PRN

## 2014-02-12 MED ORDER — BACITRACIN ZINC 500 UNIT/GM EX OINT
TOPICAL_OINTMENT | CUTANEOUS | Status: AC
Start: 2014-02-12 — End: 2014-02-12
  Filled 2014-02-12: qty 28.35

## 2014-02-12 MED ORDER — PROPOFOL 10 MG/ML IV BOLUS
INTRAVENOUS | Status: AC
  Filled 2014-02-12: qty 20

## 2014-02-12 MED ORDER — ONDANSETRON HCL 4 MG/2ML IJ SOLN
INTRAMUSCULAR | Status: DC | PRN
  Administered 2014-02-12: 4 mg via INTRAVENOUS

## 2014-02-12 MED ORDER — SODIUM CHLORIDE 0.9 % IJ SOLN
INTRAMUSCULAR | Status: AC
  Filled 2014-02-12: qty 30

## 2014-02-12 MED ORDER — ONDANSETRON HCL 4 MG/2ML IJ SOLN
INTRAMUSCULAR | Status: AC
  Filled 2014-02-12: qty 2

## 2014-02-12 MED ORDER — FENTANYL CITRATE 0.05 MG/ML IJ SOLN
INTRAMUSCULAR | Status: AC
  Filled 2014-02-12: qty 5

## 2014-02-12 MED ORDER — DEXAMETHASONE SODIUM PHOSPHATE 10 MG/ML IJ SOLN
INTRAMUSCULAR | Status: DC | PRN
  Administered 2014-02-12: 10 mg via INTRAVENOUS

## 2014-02-12 MED ORDER — CEFAZOLIN SODIUM-DEXTROSE 2-3 GM-% IV SOLR
2.0000 g | INTRAVENOUS | Status: AC
  Administered 2014-02-12: 2 g via INTRAVENOUS

## 2014-02-12 MED ORDER — LIDOCAINE HCL (PF) 2 % IJ SOLN
INTRAMUSCULAR | Status: DC | PRN
  Administered 2014-02-12: 100 mg via INTRADERMAL

## 2014-02-12 MED ORDER — HYDROMORPHONE HCL PF 1 MG/ML IJ SOLN
INTRAMUSCULAR | Status: DC | PRN
  Administered 2014-02-12 (×4): 0.5 mg via INTRAVENOUS

## 2014-02-12 MED ORDER — CEFAZOLIN SODIUM-DEXTROSE 2-3 GM-% IV SOLR
INTRAVENOUS | Status: AC
  Filled 2014-02-12: qty 50

## 2014-02-12 MED ORDER — KETOROLAC TROMETHAMINE 30 MG/ML IJ SOLN: 15.0000 mg | Freq: Once | INTRAMUSCULAR | Status: DC | PRN

## 2014-02-12 MED ORDER — FENTANYL CITRATE 0.05 MG/ML IJ SOLN: 25.0000 ug | INTRAMUSCULAR | Status: DC | PRN

## 2014-02-12 MED ORDER — DEXAMETHASONE SODIUM PHOSPHATE 10 MG/ML IJ SOLN
INTRAMUSCULAR | Status: AC
  Filled 2014-02-12: qty 1

## 2014-02-12 MED ORDER — BUPIVACAINE HCL (PF) 0.25 % IJ SOLN
INTRAMUSCULAR | Status: AC
  Filled 2014-02-12: qty 30

## 2014-02-12 MED ORDER — ATROPINE SULFATE 0.4 MG/ML IJ SOLN
INTRAMUSCULAR | Status: AC
  Filled 2014-02-12: qty 1

## 2014-02-12 MED ORDER — LACTATED RINGERS IV SOLN: INTRAVENOUS | Status: DC

## 2014-02-12 SURGICAL SUPPLY — 26 items
BLADE SURG 15 STRL LF DISP TIS (BLADE) ×1 IMPLANT
BLADE SURG 15 STRL SS (BLADE) ×2
BNDG COHESIVE 1X5 TAN STRL LF (GAUZE/BANDAGES/DRESSINGS) IMPLANT
BNDG CONFORM 2 STRL LF (GAUZE/BANDAGES/DRESSINGS) ×3 IMPLANT
CATH FOLEY LATEX FREE 16FR (CATHETERS) ×3 IMPLANT
COVER SURGICAL LIGHT HANDLE (MISCELLANEOUS) ×3 IMPLANT
DRAIN PENROSE 18X1/2 LTX STRL (DRAIN) ×3 IMPLANT
DRAPE LAPAROTOMY T 102X78X121 (DRAPES) ×3 IMPLANT
ELECT REM PT RETURN 9FT ADLT (ELECTROSURGICAL) ×3
ELECTRODE REM PT RTRN 9FT ADLT (ELECTROSURGICAL) ×1 IMPLANT
GAUZE VASELINE 1X8 (GAUZE/BANDAGES/DRESSINGS) IMPLANT
GLOVE BIOGEL M STRL SZ7.5 (GLOVE) ×3 IMPLANT
GOWN STRL REUS W/TWL XL LVL3 (GOWN DISPOSABLE) ×3 IMPLANT
KIT BASIN OR (CUSTOM PROCEDURE TRAY) ×3 IMPLANT
NEEDLE HYPO 22GX1.5 SAFETY (NEEDLE) ×3 IMPLANT
NS IRRIG 1000ML POUR BTL (IV SOLUTION) IMPLANT
PACK BASIC VI WITH GOWN DISP (CUSTOM PROCEDURE TRAY) ×3 IMPLANT
PAD TELFA 2X3 NADH STRL (GAUZE/BANDAGES/DRESSINGS) ×3 IMPLANT
PENCIL BUTTON HOLSTER BLD 10FT (ELECTRODE) ×3 IMPLANT
SUT CHROMIC 3 0 PS 2 (SUTURE) ×9 IMPLANT
SUT CHROMIC 3 0 SH 27 (SUTURE) IMPLANT
SUT MNCRL AB 3-0 PS2 18 (SUTURE) ×3 IMPLANT
SYR CONTROL 10ML LL (SYRINGE) IMPLANT
TOWEL OR 17X26 10 PK STRL BLUE (TOWEL DISPOSABLE) ×3 IMPLANT
TOWEL OR NON WOVEN STRL DISP B (DISPOSABLE) ×3 IMPLANT
WATER STERILE IRR 1500ML POUR (IV SOLUTION) IMPLANT

## 2014-02-12 NOTE — H&P (Signed)
Reason For Visit Seen today for a pre-op exam.   Active Problems Problems  1. 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 Phimosis (605) 3. Pre-op exam (V72.84)   Assessed By: Jimmey Ralph (Urology); Last Assessed: 05 Feb 2014 4. Pyuria (791.9)  History of Present Illness 79 YO male patient of Dr. Lyndal Rainbow seen today for pre-op exam. Scheduled 02/12/14 for circumscion for penile neoplasm    GU HX:    Last seen as a consult by Dr. Junious Silk 01/05/14 for penile mass referred by Dr. Maureen Ralphs. His primary care physician is Dr.Sheila C. Stallings.     1-penile mass-June 2015-patient was undergoing Left Total Knee Arthroplasty with a Foley catheter could not be placed. He was found on exam to have uncircumcised penis with phimosis and a large fungating mass involving foreskin. There was no palpable inguinal LAD. Pt is uncircumcised. He tells me he has not noticed a mass but has not been able to retract the foreskin in 6-8 months. It has bothered him before and he uses some ointments or took something for a "yeast infection". He has trouble voiding because of the phimosis and typically dribble some urine afterwards. He is not sexually active and has not had an erection in several years.    He does have some heart issues and is on Xarelto. He was able to stop Xarelto before his knee surgery.     Interval HX:   Today states he is doing well. No cough, congestion, SOB, or CP.   Past Medical History Problems  1. History of asthma (V12.69) 2. History of atrial fibrillation (V12.59) 3. History of cardiac arrhythmia (V12.59) 4. History of cardiac murmur (V12.59) 5. History of essential hypertension (V12.59) 6. History of hypercholesterolemia (V12.29)  Surgical History Problems  1. History of Hand Surgery 2. History of Knee Replacement  Current Meds 1. Advair Diskus 250-50 MCG/DOSE Inhalation Aerosol Powder Breath Activated;  Therapy: (Recorded:23Jun2015) to Recorded 2. Amiodarone HCl - 200 MG  Oral Tablet;  Therapy: (Recorded:23Jun2015) to Recorded 3. Combivent AERO;  Therapy: (YNWGNFAO:[id number]) to Recorded 4. Digoxin 125 MCG Oral Tablet;  Therapy: (ONGEXBMW:41LKG4010) to Recorded 5. Diovan HCT 160-25 MG Oral Tablet;  Therapy: (Recorded:23Jun2015) to Recorded 6. Metoprolol Tartrate 25 MG Oral Tablet;  Therapy: (Recorded:23Jun2015) to Recorded 7. Pravastatin Sodium 80 MG Oral Tablet;  Therapy: (Recorded:23Jun2015) to Recorded 8. Spironolactone 25 MG Oral Tablet;  Therapy: (Recorded:23Jun2015) to Recorded 9. Vitamin D TABS;  Therapy: (Recorded:24Jul2015) to Recorded 10. Xarelto 20 MG Oral Tablet;   Therapy: (Recorded:23Jun2015) to Recorded  Allergies Medication  1. No Known Drug Allergies  Family History Problems  1. Family history of cardiac disorder (V17.49) : Mother, Brother 2. Family history of lung cancer (V16.1) : Father  Social History Problems  1. Caffeine use (V49.89) 2. Former smoker (V15.82) 3. Married 4. No alcohol use 5. Number of children 6. Retired  Review of Systems Genitourinary, constitutional, skin, eye, otolaryngeal, hematologic/lymphatic, cardiovascular, pulmonary, endocrine, musculoskeletal, gastrointestinal, neurological and psychiatric system(s) were reviewed and pertinent findings if present are noted.    Vitals Vital Signs [Data Includes: Last 1 Day]  Recorded: 24Jul2015 09:39AM  Blood Pressure: 95 / 57 Temperature: 98.3 F Heart Rate: 64  Physical Exam Constitutional: Well nourished and well developed . No acute distress. The patient appears well hydrated.  ENT:. The oropharynx is normal.  Neck: The appearance of the neck is normal.  Pulmonary: No respiratory distress.  Cardiovascular: Heart rate and rhythm are normal.  Abdomen: The abdomen is flat. The abdomen is soft and nontender.  No suprapubic tenderness. No CVA tenderness.  Skin: Normal skin turgor and normal skin color and pigmentation.  Neuro/Psych:. Mood and affect  are appropriate.    Results/Data  The following clinical lab reports were reviewed:  UA- large amount of epithelial cells related to phimosis. Will culture urine. Selected Results  URINE CULTURE 24Jul2015 09:57AM Rosalyn Gess, Diane  SOURCE : CLEAN CATCH SPECIMEN TYPE: URINE   Test Name Result Flag Reference  CULTURE, URINE Culture, Urine    ===== COLONY COUNT: =====  >=100,000 COLONIES/ML   FINAL REPORT: PROTEUS MIRABILIS   SENSITIVITY FOR: PROTEUS MIRABILIS    AMPICILLIN                             RESISTANT       >=32    AMPICILLIN/SUL                         INDETERMINATE     16    PIPERACILLIN/TAZO                      SENSITIVE        <=4    IMIPENEM                               INDETERMINATE      8    CEFAZOLIN                              RESISTANT       >=64    CEFTRIAXONE                            SENSITIVE               CEFTAZIDIME                            SENSITIVE               CEFEPIME                               SENSITIVE               GENTAMICIN                             SENSITIVE        <=1    TOBRAMYCIN                             SENSITIVE        <=1    CIPROFLOXACIN                          SENSITIVE     <=0.25    LEVOFLOXACIN                           SENSITIVE     <=0.12    NITROFURANTOIN  RESISTANT               TRIMETH/SULFA                          SENSITIVE       <=20    END OF REPORT   UA With REFLEX [id number] 09:22AM Jimmey Ralph  SPECIMEN TYPE: CLEAN CATCH   Test Name Result Flag Reference  COLOR YELLOW  YELLOW  APPEARANCE CLOUDY A CLEAR  SPECIFIC GRAVITY 1.010  1.005-1.030  pH 6.0  5.0-8.0  GLUCOSE NEG mg/dL  NEG  BILIRUBIN NEG  NEG  KETONE NEG mg/dL  NEG  BLOOD SMALL A NEG  PROTEIN TRACE mg/dL  NEG  UROBILINOGEN 0.2 mg/dL  0.0-1.0  NITRITE POS A NEG  LEUKOCYTE ESTERASE LARGE A NEG  SQUAMOUS EPITHELIAL/HPF MANY A RARE  WBC TNTC WBC/hpf A <3  RBC 3-6 RBC/hpf A <3  BACTERIA FEW A RARE  CRYSTALS NONE SEEN   NONE SEEN  CASTS NONE SEEN  NONE SEEN   Assessment Assessed  1. Pre-op exam (V72.84) 2. 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 Phimosis (605) 4. Pyuria (791.9)  Plan  Health Maintenance  1. UA With REFLEX; [Do Not Release]; Status:Complete;   Done: [id number] 09:22AM  At this time is cleared to proceed with surgical procedure with Dr. Junious Silk. Instructed to contact office if he has any acute changes.  Will culture urine.    Signatures Electronically signed by : Jimmey Ralph, Trey Paula; Feb 09 2014  7:33AM EST

## 2014-02-12 NOTE — Transfer of Care (Signed)
Immediate Anesthesia Transfer of Care Note  Patient: Jerry Chase  Procedure(s) Performed: Procedure(s): EXAM UNDER ANESTHESIA,CIRCUMCISION ADULT, PENILE BIOPSY (N/A)  Patient Location: PACU  Anesthesia Type:General  Level of Consciousness: awake, alert , oriented and patient cooperative  Airway & Oxygen Therapy: Patient Spontanous Breathing and Patient connected to face mask oxygen  Post-op Assessment: Report given to PACU RN, Post -op Vital signs reviewed and stable and Patient moving all extremities X 4  Post vital signs: Reviewed and stable  Complications: No apparent anesthesia complications

## 2014-02-12 NOTE — Op Note (Signed)
Preoperative diagnosis: Foreskin neoplasm Postoperative diagnosis foreskin and penile skin neoplasm  Procedure: Exam under anesthesia Circumcision including frozen section and removal of foreskin and penile skin neoplasm  Surgeon: Junious Silk  Anesthesia: Rose  Type of anesthesia: Gen.  Findings: On exam under anesthesia there was a hard, indurated, verrucous mass predominantly on the ventral surface of the foreskin extending out of the penile skin. The testicles were descended bilaterally and without mass. There was no palpable inguinal lymphadenopathy. The foreskin cannot be fully retracted with out a dorsal slit. Once dorsal slit was performed and the mass was fully visualized was noted to go from the penile skin extending the length of the foreskin but then the pattern abruptly changed about 5 mm from the glans penis. Circumferentially the foreskin was fused and covering the coronal sulcus and this was not visualized. Once the lesion was excised the dartos fascia appeared normal and the under layer was sent for pathology for a deep margin. The remaining foreskin near the glans was biopsied for frozen section. The glans, corpora and urethra were all palpably normal which was better elucidated once the mass was excised.  Description of procedure: After consent was obtained patient brought to the operating room. After adequate anesthesia the penis and scrotum was prepped and draped in the usual sterile fashion. A timeout was performed to confirm the patient and procedure. A 10 cc dorsal penile block was instilled with Marcaine without epinephrine. At the 12:00 position a straight hemostat was placed in the foreskin and penile skin crush clamped and and a dorsal slit was performed. This was taken back to the coronal sulcus in the dorsal slit closed in the usual fashion with a running 3-0 chromic suture. This allowed better exploration of the mass ventrally. The glans and foreskin were reprepped with  Betadine. The wound was irrigated. The mass approach the coronal sulcus and had an abrupt change in pattern. Distal to the mass which would become the distal margin a frozen section #1 was obtained from this tissue.   While the frozen was pending a bulky portion of the penile/foreskin mass was excised in the event the frozen section was negative and more pathology was needed. This was sent as #2 penile mass biopsy The frozen section came back potential inflammation with suspicion for squamous cell carcinoma but very low level of atypia. Therefore the decision was made to perform a full circumcision including the mass as I was able to get a clean proximal margin on the penile skin.   I first placed a Foley catheter in order to delineate the urethra if needed. Then a tourniquet was placed proximally on the penis. The circumcision was done by incorporating the prior dorsal slit from 12:00 coming around proximally on the ventral surface to encompass the entire mass and going up on the other side. Then the preputial cut on the dorsal slit was taken down going around the mass distally to the ventral surface. I was able to develop a clean plane under the mass within the dartos fascia. The foreskin/penile skin and mass removed which was specimen #3.  The dartos fascia underneath the area of the mass again appeared normal and this was carefully dissected off the urethra and sent as a deep margin #4.  The wound was carefully irrigated. Hemostasis was excellent.  What remained was adequate skin and once it was reapproximated with a 3-0 chromic at the 12:00 and 3:00 position appeared to be good cosmesis without any rotation. The circumcision was  reapproximated using interrupted 3-0 chromic. The circumcision was cleaned and bacitracin ointment was placed on the glans. A Telfa and a loose conform wrap were placed on the penis. The patient was awakened taken to recovery room in stable condition.  Complications:  None Blood loss: Minimal Drains: None  Specimens:  #1 foreskin biopsy which is the distal margin. The foreskin in this area looks similar from about 10:00 down around the 6:00 on the right side of the penis progressing ventrally. This may need to be excised in the future. #2 penile mass biopsy from the bulk of the mass. This was excised in its entirety. #3 penile/foreskin including the mass #4 dartos fascia deep to mass  Disposition: Patient stable to PACU

## 2014-02-12 NOTE — Interval H&P Note (Signed)
History and Physical Interval Note:  02/12/2014 7:27 AM  Jerry Chase  has presented today for surgery, with the diagnosis of Phimosis, Penile Neoplasm  The various methods of treatment have been discussed with the patient and family. After consideration of risks, benefits and other options for treatment, the patient has consented to  Procedure(s): CIRCUMCISION ADULT (N/A) as a surgical intervention .  The patient's history has been reviewed, patient examined, no change in status, stable for surgery.  I have reviewed the patient's chart and labs.  Questions were answered to the patient's satisfaction.  He has a preputial mass. Some of the inflammation has improved on exam this AM but the mass is still present. We discussed it and he's not ready for penectomy should it be required to get clear margins. He agrees to proceed with excisional biopsy and possible circumcision if clear margins can be obtained.    [name]

## 2014-02-12 NOTE — Anesthesia Postprocedure Evaluation (Signed)
  Anesthesia Post-op Note  Patient: Jerry Chase  Procedure(s) Performed: Procedure(s) (LRB): EXAM UNDER ANESTHESIA,CIRCUMCISION ADULT, PENILE BIOPSY (N/A)  Patient Location: PACU  Anesthesia Type: General  Level of Consciousness: awake and alert   Airway and Oxygen Therapy: Patient Spontanous Breathing  Post-op Pain: mild  Post-op Assessment: Post-op Vital signs reviewed, Patient's Cardiovascular Status Stable, Respiratory Function Stable, Patent Airway and No signs of Nausea or vomiting  Last Vitals:  Filed Vitals:   02/12/14 1000  BP: 128/46  Pulse: 65  Temp: 36.8 C  Resp: 16    Post-op Vital Signs: stable   Complications: No apparent anesthesia complications

## 2014-02-12 NOTE — Discharge Instructions (Signed)
Circumcision, Care After Refer to this sheet in the next few weeks. These instructions provide you with information on caring for yourself after your procedure. Your health care provider may also give you more specific instructions. Your treatment has been planned according to current medical practices, but problems sometimes occur. Call your health care provider if you have any problems or questions after your procedure. WHAT TO EXPECT AFTER THE PROCEDURE After your procedure, it is typical to have the following sensations:  Redness at the incision site.  Soreness at the incision site.  Slight swelling around the incision. HOME CARE INSTRUCTIONS  Take all medicines as directed by your health care provider.  Any dressings should stay on for at least 8 hours. You may take the dressing off at night to let air circulate around the incision. Once a scab has formed over the incision, you no longer need to cover your incision with a dressing.  Carefully remove the bandage if it gets dirty.   Apply a small amount of bacitracin ointment to the head of the penis and circumcision twice a day.   Carefully put a new bandage on if a scab has not formed.  Do not have sex until your health care provider says it is okay.  Do not get your incision site wet for 12 hours or as told by your health care provider.  You may take a sponge bath. Clean around the incision site gently with mild soap and water.  You may take a shower after 24 hours or as told by your health care provider.   Do not take a tub bath. After you shower, gently pat dry the incision site. Do not rub it.  Avoid heavy lifting.  Avoid contact sports, biking, or swimming until you have healed. This will usually be between 10 days and 14 days after the procedure. SEEK MEDICAL CARE IF:  You are experiencing pain that is not relieved by your medicine.  You have swelling or redness that is unexpected.  You develop a fever. SEEK  IMMEDIATE MEDICAL CARE IF:  You cannot urinate.  You have pain when you urinate.  Your pain is not helped by medicines.  There is redness, swelling, and pain (inflammation) spreading up the shaft of your penis, thighs, or lower abdomen.  There is pus coming from your incision.  You have bleeding that does not stop when you press on it. Document Released: 04/22/2013 Document Revised: 11/16/2013 Document Reviewed: 04/22/2013 [facility] Patient Information 2015 [place], [place]. This information is not intended to replace advice given to you by your health care provider. Make sure you discuss any questions you have with your health care provider.

## 2014-02-15 ENCOUNTER — Encounter (HOSPITAL_COMMUNITY): Payer: Self-pay | Admitting: Urology

## 2014-04-16 ENCOUNTER — Other Ambulatory Visit: Payer: Self-pay | Admitting: Urology

## 2014-04-16 MED ORDER — MITOMYCIN CHEMO FOR BLADDER INSTILLATION 40 MG
40.0000 mg | Freq: Once | INTRAVENOUS | Status: DC
Start: 2014-05-11 — End: 2014-05-11

## 2014-04-28 ENCOUNTER — Encounter (HOSPITAL_COMMUNITY): Payer: Self-pay | Admitting: Pharmacy Technician

## 2014-04-29 ENCOUNTER — Encounter (HOSPITAL_COMMUNITY): Payer: Self-pay

## 2014-05-03 ENCOUNTER — Encounter (HOSPITAL_COMMUNITY): Payer: Self-pay

## 2014-05-03 ENCOUNTER — Encounter (HOSPITAL_COMMUNITY)
Admission: RE | Admit: 2014-05-03 | Discharge: 2014-05-03 | Disposition: A | Discharge status: Home / Self Care | Payer: Medicare Other | Source: Ambulatory Visit | Attending: Urology | Admitting: Urology

## 2014-05-03 DIAGNOSIS — Z01818 Encounter for other preprocedural examination: Secondary | ICD-10-CM | POA: Insufficient documentation

## 2014-05-03 DIAGNOSIS — D414 Neoplasm of uncertain behavior of bladder: Secondary | ICD-10-CM | POA: Insufficient documentation

## 2014-05-03 DIAGNOSIS — I4891 Unspecified atrial fibrillation: Secondary | ICD-10-CM | POA: Insufficient documentation

## 2014-05-03 DIAGNOSIS — C609 Malignant neoplasm of penis, unspecified: Secondary | ICD-10-CM | POA: Insufficient documentation

## 2014-05-03 HISTORY — DX: Neoplasm of unspecified behavior of bladder: D49.4

## 2014-05-03 HISTORY — DX: Cardiomyopathy, unspecified: I42.9

## 2014-05-03 HISTORY — DX: Chronic combined systolic (congestive) and diastolic (congestive) heart failure: I50.42

## 2014-05-03 LAB — BASIC METABOLIC PANEL
Anion gap: 12 (ref 5–15)
BUN: 30 mg/dL — ABNORMAL HIGH (ref 6–23)
CO2: 26 meq/L (ref 19–32)
CREATININE: 1.13 mg/dL (ref 0.50–1.35)
Calcium: 9 mg/dL (ref 8.4–10.5)
Chloride: 101 mEq/L (ref 96–112)
GFR calc Af Amer: 70 mL/min — ABNORMAL LOW (ref >90)
GFR calc non Af Amer: 61 mL/min — ABNORMAL LOW (ref >90)
Glucose, Bld: 96 mg/dL (ref 70–99)
Potassium: 4.3 mEq/L (ref 3.7–5.3)
Sodium: 139 mEq/L (ref 137–147)

## 2014-05-03 LAB — CBC
HCT: 36.9 % — ABNORMAL LOW (ref 39.0–52.0)
Hemoglobin: 11.9 g/dL — ABNORMAL LOW (ref 13.0–17.0)
MCH: 29.2 pg (ref 26.0–34.0)
MCHC: 32.2 g/dL (ref 30.0–36.0)
MCV: 90.7 fL (ref 78.0–100.0)
Platelets: 343 10*3/uL (ref 150–400)
RBC: 4.07 MIL/uL — AB (ref 4.22–5.81)
RDW: 13.3 % (ref 11.5–15.5)
WBC: 11.2 10*3/uL — ABNORMAL HIGH (ref 4.0–10.5)

## 2014-05-03 LAB — PROTIME-INR
INR: 1.34 (ref 0.00–1.49)
Prothrombin Time: 16.7 seconds — ABNORMAL HIGH (ref 11.6–15.2)

## 2014-05-03 LAB — APTT: aPTT: 33 seconds (ref 24–37)

## 2014-05-03 NOTE — Pre-Procedure Instructions (Signed)
PAM GIBSON, SURGERY SCHEDULER FOR DR. Junious Silk CALLED - STATES SHE HAS APPT FOR PT TO SEE DR. GANJI ON WED 10/21 3PM - SHE WILL LET PT KNOW - AND WOULD I PLEASE FAX PT'S EKG TO DR. GANJI'S OFFICE.  I DID FAX THE EKG AND I HAD ALSO GIVEN PT A COPY OF HIS EKG TO TAKE TO DR. Einar Gip.

## 2014-05-03 NOTE — Patient Instructions (Signed)
YOUR SURGERY IS SCHEDULED AT [facility]  ON:  Tuesday  10/27  REPORT TO  SHORT STAY CENTER AT:  8:30 AM     DO NOT EAT OR DRINK ANYTHING AFTER MIDNIGHT THE NIGHT BEFORE YOUR SURGERY.  YOU MAY BRUSH YOUR TEETH, RINSE OUT YOUR MOUTH--BUT NO WATER, NO FOOD, NO CHEWING GUM, NO MINTS, NO CANDIES, NO CHEWING TOBACCO.  PLEASE TAKE THE FOLLOWING MEDICATIONS THE AM OF YOUR SURGERY WITH A FEW SIPS OF WATER:  AMIODARONE, METOPROLOL, PRAVASTATIN.  USE BOTH OF YOUR INHALERS.    DO NOT BRING VALUABLES, MONEY, CREDIT CARDS.  DO NOT WEAR JEWELRY, MAKE-UP, NAIL POLISH AND NO METAL PINS OR CLIPS IN YOUR HAIR. CONTACT LENS, DENTURES / PARTIALS, GLASSES SHOULD NOT BE WORN TO SURGERY AND IN MOST CASES-HEARING AIDS WILL NEED TO BE REMOVED.  BRING YOUR GLASSES CASE, ANY EQUIPMENT NEEDED FOR YOUR CONTACT LENS. FOR PATIENTS ADMITTED TO THE HOSPITAL--CHECK OUT TIME THE DAY OF DISCHARGE IS 11:00 AM.  ALL INPATIENT ROOMS ARE PRIVATE - WITH BATHROOM, TELEPHONE, TELEVISION AND WIFI INTERNET.  IF YOU ARE BEING DISCHARGED THE SAME DAY OF YOUR SURGERY--YOU CAN NOT DRIVE YOURSELF HOME--AND SHOULD NOT GO HOME ALONE BY TAXI OR BUS.  NO DRIVING OR OPERATING MACHINERY, OR MAKING LEGAL DECISIONS FOR 24 HOURS FOLLOWING ANESTHESIA / PAIN MEDICATIONS.  PLEASE MAKE ARRANGEMENTS FOR SOMEONE TO BE WITH YOU AT HOME THE FIRST 24 HOURS AFTER SURGERY. RESPONSIBLE DRIVER'S NAME / PHONE   PT'S WIFE WILL BE WITH HIM                                                     PLEASE BE AWARE THAT YOU MAY NEED ADDITIONAL BLOOD DRAWN DAY OF YOUR SURGERY  _______________________________________________________________________   [facility] - Preparing for Surgery Before surgery, you can play an important role.  Because skin is not sterile, your skin needs to be as free of germs as possible.  You can reduce the number of germs on your skin by washing with CHG (chlorahexidine gluconate) soap before surgery.  CHG is an antiseptic  cleaner which kills germs and bonds with the skin to continue killing germs even after washing. Please DO NOT use if you have an allergy to CHG or antibacterial soaps.  If your skin becomes reddened/irritated stop using the CHG and inform your nurse when you arrive at Short Stay. Do not shave (including legs and underarms) for at least 48 hours prior to the first CHG shower.  You may shave your face/neck. Please follow these instructions carefully:  1.  Shower with CHG Soap the night before surgery and the  morning of Surgery.  2.  If you choose to wash your hair, wash your hair first as usual with your  normal  shampoo.  3.  After you shampoo, rinse your hair and body thoroughly to remove the  shampoo.                           4.  Use CHG as you would any other liquid soap.  You can apply chg directly  to the skin and wash                       Gently with a scrungie or clean washcloth.  5.  Apply the CHG Soap to your body ONLY FROM THE NECK DOWN.   Do not use on face/ open                           Wound or open sores. Avoid contact with eyes, ears mouth and genitals (private parts).                       Wash face,  Genitals (private parts) with your normal soap.             6.  Wash thoroughly, paying special attention to the area where your surgery  will be performed.  7.  Thoroughly rinse your body with warm water from the neck down.  8.  DO NOT shower/wash with your normal soap after using and rinsing off  the CHG Soap.                9.  Pat yourself dry with a clean towel.            10.  Wear clean pajamas.            11.  Place clean sheets on your bed the night of your first shower and do not  sleep with pets. Day of Surgery : Do not apply any lotions/deodorants the morning of surgery.  Please wear clean clothes to the hospital/surgery center.  FAILURE TO FOLLOW THESE INSTRUCTIONS MAY RESULT IN THE CANCELLATION OF YOUR SURGERY PATIENT  SIGNATURE_________________________________  NURSE SIGNATURE__________________________________  ________________________________________________________________________

## 2014-05-03 NOTE — Pre-Procedure Instructions (Signed)
PT'S PREOP BUN 30 - I FAXED COPIES OF PREOP LABS TO DR. Lyndal Rainbow OFFICE.

## 2014-05-03 NOTE — Pre-Procedure Instructions (Addendum)
PT HAS CLEARANCE FOR SURGERY  AND EKG AND OFFICE NOTE FROM DR. Einar Gip 03/05/14. CXR REPORT IN EPIC FROM 08-24-13. PT'S PULSE IRREGULAR TODAY AT PREOP VISIT.  EKG REPEATED - PRELIMINARY COPY SHOWED PT BACK IN ATRIAL FIB.  SPOKE WITH DR. Landry Dyke - HE STATES PT'S CARDIOLOGIST NEEDS TO BE AWARE PT BACK IN ATRIAL FIB - PT WILL NEED NEW CLEARANCE FOR SURGERY.  PT AND HIS WIFE AWARE - I LEFT A MESSAGE FOR DR. Lyndal Rainbow SURGERY SCHEDULER TO PLEASE ARRANGE FOR PT TO SEE DR. GANJI FOR ATRIAL FIB AND NEW CLEARANCE FOR SURGERY 05/11/14.

## 2014-05-03 NOTE — Progress Notes (Signed)
05/03/14 0817  OBSTRUCTIVE SLEEP APNEA  Have you ever been diagnosed with sleep apnea through a sleep study? No  Do you snore loudly (loud enough to be heard through closed doors)?  0  Do you often feel tired, fatigued, or sleepy during the daytime? 0  Do you have, or are you being treated for high blood pressure? 1  BMI more than 35 kg/m2? 0  Age over 77 years old? 1  Neck circumference greater than 40 cm/16 inches? 1  Gender: 1  Obstructive Sleep Apnea Score 4  Score 4 or greater  Results sent to PCP

## 2014-05-04 NOTE — Pre-Procedure Instructions (Signed)
FAXED NOTE RECEIVED FROM DR. GANJI'S OFFICE STATING PT AT LOW RISK FOR PERIOPERATIVE CV COMPLICATION AND NO NEED FOR OFFICE VISIT - SAME INSTRUCTIONS AS BEFORE.  PAM AT DR. Lyndal Rainbow OFFICE STATES COPY OF THE CLEARANCE ALSO RECEIVED BY THEIR OFFICE.  SHE WILL TRY TO CALL PT AND BE SURE HE IS AWARE OF CLEARANCE AND THAT HE DOES NOT HAVE TO KEEP APPOINTMENT WITH DR. Kennard ON 10/21.

## 2014-05-11 ENCOUNTER — Ambulatory Visit (HOSPITAL_COMMUNITY)
Admission: RE | Admit: 2014-05-11 | Discharge: 2014-05-11 | Disposition: A | Discharge status: Home / Self Care | Payer: Medicare Other | Source: Ambulatory Visit | Attending: Urology | Admitting: Urology

## 2014-05-11 ENCOUNTER — Encounter (HOSPITAL_COMMUNITY)
Admission: RE | Disposition: A | Discharge status: Home / Self Care | Payer: Self-pay | Source: Ambulatory Visit | Attending: Urology

## 2014-05-11 ENCOUNTER — Encounter (HOSPITAL_COMMUNITY): Payer: Medicare Other | Admitting: Anesthesiology

## 2014-05-11 ENCOUNTER — Encounter (HOSPITAL_COMMUNITY): Payer: Self-pay | Admitting: *Deleted

## 2014-05-11 ENCOUNTER — Ambulatory Visit (HOSPITAL_COMMUNITY): Payer: Medicare Other | Admitting: Anesthesiology

## 2014-05-11 DIAGNOSIS — J45909 Unspecified asthma, uncomplicated: Secondary | ICD-10-CM | POA: Insufficient documentation

## 2014-05-11 DIAGNOSIS — I10 Essential (primary) hypertension: Secondary | ICD-10-CM | POA: Diagnosis not present

## 2014-05-11 DIAGNOSIS — Z87891 Personal history of nicotine dependence: Secondary | ICD-10-CM | POA: Insufficient documentation

## 2014-05-11 DIAGNOSIS — C679 Malignant neoplasm of bladder, unspecified: Secondary | ICD-10-CM | POA: Insufficient documentation

## 2014-05-11 DIAGNOSIS — N3000 Acute cystitis without hematuria: Secondary | ICD-10-CM | POA: Diagnosis not present

## 2014-05-11 DIAGNOSIS — R011 Cardiac murmur, unspecified: Secondary | ICD-10-CM | POA: Diagnosis not present

## 2014-05-11 DIAGNOSIS — E780 Pure hypercholesterolemia: Secondary | ICD-10-CM | POA: Diagnosis not present

## 2014-05-11 DIAGNOSIS — D414 Neoplasm of uncertain behavior of bladder: Secondary | ICD-10-CM

## 2014-05-11 DIAGNOSIS — I499 Cardiac arrhythmia, unspecified: Secondary | ICD-10-CM | POA: Insufficient documentation

## 2014-05-11 DIAGNOSIS — N3020 Other chronic cystitis without hematuria: Secondary | ICD-10-CM | POA: Diagnosis not present

## 2014-05-11 DIAGNOSIS — I4891 Unspecified atrial fibrillation: Secondary | ICD-10-CM | POA: Diagnosis not present

## 2014-05-11 DIAGNOSIS — N3289 Other specified disorders of bladder: Secondary | ICD-10-CM | POA: Diagnosis not present

## 2014-05-11 DIAGNOSIS — C600 Malignant neoplasm of prepuce: Secondary | ICD-10-CM | POA: Diagnosis not present

## 2014-05-11 DIAGNOSIS — C609 Malignant neoplasm of penis, unspecified: Secondary | ICD-10-CM | POA: Diagnosis present

## 2014-05-11 HISTORY — PX: TRANSURETHRAL RESECTION OF BLADDER TUMOR WITH GYRUS (TURBT-GYRUS): SHX6458

## 2014-05-11 HISTORY — PX: CIRCUMCISION: SHX1350

## 2014-05-11 SURGERY — TRANSURETHRAL RESECTION OF BLADDER TUMOR WITH GYRUS (TURBT-GYRUS)
Anesthesia: General

## 2014-05-11 MED ORDER — ACETAMINOPHEN 10 MG/ML IV SOLN
1000.0000 mg | Freq: Once | INTRAVENOUS | Status: AC
  Administered 2014-05-11: 1000 mg via INTRAVENOUS
  Filled 2014-05-11: qty 100

## 2014-05-11 MED ORDER — CEPHALEXIN 500 MG PO TABS: 500.0000 mg | ORAL_TABLET | Freq: Two times a day (BID) | ORAL | Status: AC

## 2014-05-11 MED ORDER — BELLADONNA ALKALOIDS-OPIUM 16.2-60 MG RE SUPP
RECTAL | Status: AC
  Filled 2014-05-11: qty 1

## 2014-05-11 MED ORDER — FENTANYL CITRATE 0.05 MG/ML IJ SOLN
INTRAMUSCULAR | Status: DC | PRN
Start: 2014-05-11 — End: 2014-05-11
  Administered 2014-05-11 (×4): 50 ug via INTRAVENOUS

## 2014-05-11 MED ORDER — BACITRACIN ZINC 500 UNIT/GM EX OINT
TOPICAL_OINTMENT | CUTANEOUS | Status: AC
  Filled 2014-05-11: qty 28.35

## 2014-05-11 MED ORDER — CEFAZOLIN SODIUM-DEXTROSE 2-3 GM-% IV SOLR
INTRAVENOUS | Status: AC
  Filled 2014-05-11: qty 50

## 2014-05-11 MED ORDER — FENTANYL CITRATE 0.05 MG/ML IJ SOLN: 25.0000 ug | INTRAMUSCULAR | Status: DC | PRN

## 2014-05-11 MED ORDER — FENTANYL CITRATE 0.05 MG/ML IJ SOLN
INTRAMUSCULAR | Status: AC
  Filled 2014-05-11: qty 2

## 2014-05-11 MED ORDER — MITOMYCIN CHEMO FOR BLADDER INSTILLATION 40 MG
40.0000 mg | Freq: Once | INTRAVENOUS | Status: AC
  Administered 2014-05-11: 40 mg via INTRAVESICAL
  Filled 2014-05-11: qty 40

## 2014-05-11 MED ORDER — EPHEDRINE SULFATE 50 MG/ML IJ SOLN
INTRAMUSCULAR | Status: DC | PRN
  Administered 2014-05-11 (×4): 5 mg via INTRAVENOUS

## 2014-05-11 MED ORDER — RIVAROXABAN 20 MG PO TABS: 20.0000 mg | ORAL_TABLET | Freq: Every day | ORAL | Status: AC

## 2014-05-11 MED ORDER — LACTATED RINGERS IV SOLN
INTRAVENOUS | Status: DC
  Administered 2014-05-11: 1000 mL via INTRAVENOUS
  Administered 2014-05-11: 12:00:00 via INTRAVENOUS

## 2014-05-11 MED ORDER — BUPIVACAINE HCL 0.25 % IJ SOLN
INTRAMUSCULAR | Status: AC
  Filled 2014-05-11: qty 1

## 2014-05-11 MED ORDER — CEFAZOLIN SODIUM-DEXTROSE 2-3 GM-% IV SOLR
2.0000 g | INTRAVENOUS | Status: AC
  Administered 2014-05-11: 2 g via INTRAVENOUS

## 2014-05-11 MED ORDER — ONDANSETRON HCL 4 MG/2ML IJ SOLN
INTRAMUSCULAR | Status: AC
  Filled 2014-05-11: qty 2

## 2014-05-11 MED ORDER — BELLADONNA ALKALOIDS-OPIUM 16.2-60 MG RE SUPP
RECTAL | Status: DC | PRN
  Administered 2014-05-11: 1 via RECTAL

## 2014-05-11 MED ORDER — EPHEDRINE SULFATE 50 MG/ML IJ SOLN
INTRAMUSCULAR | Status: AC
  Filled 2014-05-11: qty 1

## 2014-05-11 MED ORDER — HYDROMORPHONE HCL 1 MG/ML IJ SOLN
INTRAMUSCULAR | Status: DC | PRN
  Administered 2014-05-11 (×3): .4 mg via INTRAVENOUS

## 2014-05-11 MED ORDER — LIDOCAINE HCL (CARDIAC) 20 MG/ML IV SOLN
INTRAVENOUS | Status: DC | PRN
  Administered 2014-05-11: 50 mg via INTRAVENOUS

## 2014-05-11 MED ORDER — PROPOFOL 10 MG/ML IV BOLUS
INTRAVENOUS | Status: DC | PRN
  Administered 2014-05-11: 200 mg via INTRAVENOUS

## 2014-05-11 MED ORDER — SODIUM CHLORIDE 0.9 % IR SOLN
Status: DC | PRN
  Administered 2014-05-11: 9000 mL

## 2014-05-11 MED ORDER — HYDROMORPHONE HCL 2 MG/ML IJ SOLN
INTRAMUSCULAR | Status: AC
  Filled 2014-05-11: qty 1

## 2014-05-11 MED ORDER — SODIUM CHLORIDE 0.9 % IJ SOLN
INTRAMUSCULAR | Status: AC
  Filled 2014-05-11: qty 10

## 2014-05-11 MED ORDER — PROPOFOL 10 MG/ML IV BOLUS
INTRAVENOUS | Status: AC
  Filled 2014-05-11: qty 20

## 2014-05-11 MED ORDER — ONDANSETRON HCL 4 MG/2ML IJ SOLN
INTRAMUSCULAR | Status: DC | PRN
  Administered 2014-05-11: 4 mg via INTRAVENOUS

## 2014-05-11 MED ORDER — LIDOCAINE HCL 2 % EX GEL
CUTANEOUS | Status: AC
  Filled 2014-05-11: qty 10

## 2014-05-11 MED ORDER — LACTATED RINGERS IV SOLN: INTRAVENOUS | Status: DC

## 2014-05-11 MED ORDER — BUPIVACAINE HCL (PF) 0.25 % IJ SOLN
INTRAMUSCULAR | Status: DC | PRN
  Administered 2014-05-11: 8 mL

## 2014-05-11 SURGICAL SUPPLY — 46 items
BAG URINE DRAINAGE (UROLOGICAL SUPPLIES) ×3 IMPLANT
BAG URO CATCHER STRL LF (DRAPE) ×3 IMPLANT
BLADE SURG 15 STRL LF DISP TIS (BLADE) ×1 IMPLANT
BLADE SURG 15 STRL SS (BLADE) ×2
BNDG COHESIVE 1X5 TAN STRL LF (GAUZE/BANDAGES/DRESSINGS) IMPLANT
BNDG CONFORM 2 STRL LF (GAUZE/BANDAGES/DRESSINGS) ×3 IMPLANT
CATH FOLEY 2WAY SLVR  5CC 18FR (CATHETERS) ×2
CATH FOLEY 2WAY SLVR 5CC 18FR (CATHETERS) ×1 IMPLANT
CATH FOLEY 3WAY 30CC 22FR (CATHETERS) IMPLANT
COVER SURGICAL LIGHT HANDLE (MISCELLANEOUS) ×3 IMPLANT
DRAPE CAMERA CLOSED 9X96 (DRAPES) ×3 IMPLANT
DRAPE LAPAROTOMY T 102X78X121 (DRAPES) ×3 IMPLANT
ELECT BUTTON HF 24-28F 2 30DE (ELECTRODE) IMPLANT
ELECT LOOP MED HF 24F 12D (CUTTING LOOP) IMPLANT
ELECT LOOP MED HF 24F 12D CBL (CLIP) ×3 IMPLANT
ELECT REM PT RETURN 9FT ADLT (ELECTROSURGICAL) ×3
ELECT RESECT VAPORIZE 12D CBL (ELECTRODE) IMPLANT
ELECTRODE REM PT RTRN 9FT ADLT (ELECTROSURGICAL) ×1 IMPLANT
EVACUATOR MICROVAS BLADDER (UROLOGICAL SUPPLIES) IMPLANT
GAUZE SPONGE 4X4 16PLY XRAY LF (GAUZE/BANDAGES/DRESSINGS) ×3 IMPLANT
GAUZE VASELINE 1X8 (GAUZE/BANDAGES/DRESSINGS) ×3 IMPLANT
GLOVE BIOGEL M STRL SZ7.5 (GLOVE) ×3 IMPLANT
GOWN STRL REUS W/TWL XL LVL3 (GOWN DISPOSABLE) ×3 IMPLANT
HOLDER FOLEY CATH W/STRAP (MISCELLANEOUS) ×3 IMPLANT
IV NS IRRIG 3000ML ARTHROMATIC (IV SOLUTION) IMPLANT
KIT BASIN OR (CUSTOM PROCEDURE TRAY) ×3 IMPLANT
MANIFOLD NEPTUNE II (INSTRUMENTS) ×3 IMPLANT
NEEDLE HYPO 22GX1.5 SAFETY (NEEDLE) ×3 IMPLANT
NS IRRIG 1000ML POUR BTL (IV SOLUTION) IMPLANT
PACK BASIC VI WITH GOWN DISP (CUSTOM PROCEDURE TRAY) IMPLANT
PACK CYSTO (CUSTOM PROCEDURE TRAY) ×3 IMPLANT
PAD TELFA 2X3 NADH STRL (GAUZE/BANDAGES/DRESSINGS) IMPLANT
PENCIL BUTTON HOLSTER BLD 10FT (ELECTRODE) ×3 IMPLANT
SUT CHROMIC 3 0 PS 2 (SUTURE) IMPLANT
SUT CHROMIC 3 0 SH 27 (SUTURE) IMPLANT
SUT CHROMIC 4 0 RB 1X27 (SUTURE) ×3 IMPLANT
SUT MNCRL AB 3-0 PS2 18 (SUTURE) IMPLANT
SUT VIC AB 4-0 RB1 27 (SUTURE) ×2
SUT VIC AB 4-0 RB1 27XBRD (SUTURE) ×1 IMPLANT
SYR 30ML LL (SYRINGE) IMPLANT
SYR CONTROL 10ML LL (SYRINGE) IMPLANT
TOWEL OR 17X26 10 PK STRL BLUE (TOWEL DISPOSABLE) ×3 IMPLANT
TOWEL OR NON WOVEN STRL DISP B (DISPOSABLE) ×6 IMPLANT
TUBING CONNECTING 10 (TUBING) ×2 IMPLANT
TUBING CONNECTING 10' (TUBING) ×1
WATER STERILE IRR 1500ML POUR (IV SOLUTION) IMPLANT

## 2014-05-11 NOTE — Discharge Instructions (Signed)
Circumcision, Care After Refer to this sheet in the next few weeks. These instructions provide you with information on caring for yourself after your procedure. Your health care provider may also give you more specific instructions. Your treatment has been planned according to current medical practices, but problems sometimes occur. Call your health care provider if you have any problems or questions after your procedure. WHAT TO EXPECT AFTER THE PROCEDURE After your procedure, it is typical to have the following sensations:  Redness at the incision site.  Soreness at the incision site.  Slight swelling around the incision. HOME CARE INSTRUCTIONS  Take all medicines as directed by your health care provider.  Any dressings should stay on for at least 24 hours. You may take the dressing off at night to let air circulate around the incision. Once a scab has formed over the incision, you no longer need to cover your incision with a dressing.  Carefully remove the bandage if it gets dirty. Apply bacitracin ointment to the head of the penis and the tip of the penis at the catheter. Carefully put a new bandage on.   Do not have sex until your health care provider says it is okay.  Do not get your incision site wet for 24 hours or as told by your health care provider.  You may take a sponge bath. Clean around the incision site gently with mild soap and water.  You may take a shower after 24 hours or as told by your health care provider. Do not take a tub bath. After you shower, gently pat dry the incision site. Do not rub it.  Avoid heavy lifting.  Avoid contact sports, biking, or swimming until you have healed. This will usually be between 10 days and 14 days after the procedure.  EVERY DAY - Apply bacitracin ointment to the head of the penis and the tip of the penis at the catheter. Carefully put a new bandage on.  SEEK MEDICAL CARE IF:  You are experiencing pain that is not relieved by  your medicine.  You have swelling or redness that is unexpected.  You develop a fever. SEEK IMMEDIATE MEDICAL CARE IF:  You cannot urinate.  You have pain when you urinate.  Your pain is not helped by medicines.  There is redness, swelling, and pain (inflammation) spreading up the shaft of your penis, thighs, or lower abdomen.  There is pus coming from your incision.  You have bleeding that does not stop when you press on it. Document Released: 04/22/2013 Document Revised: 11/16/2013 Document Reviewed: 04/22/2013 [facility] Patient Information 2015 [place], [place]. This information is not intended to replace advice given to you by your health care provider. Make sure you discuss any questions you have with your health care provider. Foley Catheter Care A Foley catheter is a soft, flexible tube. This tube is placed into your bladder to drain pee (urine). If you go home with this catheter in place, follow the instructions below. TAKING CARE OF THE CATHETER 1. Wash your hands with soap and water. 2. Put soap and water on a clean washcloth.  Clean the skin where the tube goes into your body.  Clean away from the tube site.  Never wipe toward the tube.  Clean the area using a circular motion.  Remove all the soap. Pat the area dry with a clean towel. For males, reposition the skin that covers the end of the penis (foreskin). 3. Attach the tube to your leg with tape  or a leg strap. Do not stretch the tube tight. If you are using tape, remove any stickiness left behind by past tape you used. 4. Keep the drainage bag below your hips. Keep it off the floor. 5. Check your tube during the day. Make sure it is working and draining. Make sure the tube does not curl, twist, or bend. 6. Do not pull on the tube or try to take it out. TAKING CARE OF THE DRAINAGE BAGS You will have a large overnight drainage bag and a small leg bag. You may wear the overnight bag any time. Never wear the small  bag at night. Follow the directions below. Emptying the Drainage Bag Empty your drainage bag when it is  - full or at least 2-3 times a day. 1. Wash your hands with soap and water. 2. Keep the drainage bag below your hips. 3. Hold the dirty bag over the toilet or clean container. 4. Open the pour spout at the bottom of the bag. Empty the pee into the toilet or container. Do not let the pour spout touch anything. 5. Clean the pour spout with a gauze pad or cotton ball that has rubbing alcohol on it. 6. Close the pour spout. 7. Attach the bag to your leg with tape or a leg strap. 8. Wash your hands well. Changing the Drainage Bag Change your bag once a month or sooner if it starts to smell or look dirty.  1. Wash your hands with soap and water. 2. Pinch the rubber tube so that pee does not spill out. 3. Disconnect the catheter tube from the drainage tube at the connection valve. Do not let the tubes touch anything. 4. Clean the end of the catheter tube with an alcohol wipe. Clean the end of a the drainage tube with a different alcohol wipe. 5. Connect the catheter tube to the drainage tube of the clean drainage bag. 6. Attach the new bag to the leg with tape or a leg strap. Avoid attaching the new bag too tightly. 7. Wash your hands well. Cleaning the Drainage Bag 1. Wash your hands with soap and water. 2. Wash the bag in warm, soapy water. 3. Rinse the bag with warm water. 4. Fill the bag with a mixture of white vinegar and water (1 cup vinegar to 1 quart warm water [.2 liter vinegar to 1 liter warm water]). Close the bag and soak it for 30 minutes in the solution. 5. Rinse the bag with warm water. 6. Hang the bag to dry with the pour spout open and hanging downward. 7. Store the clean bag (once it is dry) in a clean plastic bag. 8. Wash your hands well. PREVENT INFECTION  Wash your hands before and after touching your tube.  Take showers every day. Wash the skin where the tube  enters your body. Do not take baths. Replace wet leg straps with dry ones, if this applies.  Do not use powders, sprays, or lotions on the genital area. Only use creams, lotions, or ointments as told by your doctor.  For females, wipe from front to back after going to the bathroom.  Drink enough fluids to keep your pee clear or pale yellow unless you are told not to have too much fluid (fluid restriction).  Do not let the drainage bag or tubing touch or lie on the floor.  Wear cotton underwear to keep the area dry. GET HELP IF:  Your pee is cloudy or smells unusually bad.  Your tube becomes clogged.  You are not draining pee into the bag or your bladder feels full.  Your tube starts to leak. GET HELP RIGHT AWAY IF:  You have pain, puffiness (swelling), redness, or yellowish-white fluid (pus) where the tube enters the body.  You have pain in the belly (abdomen), legs, lower back, or bladder.  You have a fever.  You see blood fill the tube, or your pee is pink or red.  You feel sick to your stomach (nauseous), throw up (vomit), or have chills.  Your tube gets pulled out. MAKE SURE YOU:   Understand these instructions.  Will watch your condition.  Will get help right away if you are not doing well or get worse. Document Released: 10/27/2012 Document Revised: 11/16/2013 Document Reviewed: 10/27/2012 [facility] Patient Information 2015 [place], [place]. This information is not intended to replace advice given to you by your health care provider. Make sure you discuss any questions you have with your health care provider.

## 2014-05-11 NOTE — Interval H&P Note (Signed)
History and Physical Interval Note:  05/11/2014 9:42 AM  Jerry Chase  has presented today for surgery, with the diagnosis of Bladder Neoplasm, Penile Cancer  The various methods of treatment have been discussed with the patient and family. After consideration of risks, benefits and other options for treatment, the patient has consented to  Procedure(s) with comments: TRANSURETHRAL RESECTION OF BLADDER TUMOR WITH GYRUS (TURBT-GYRUS) (N/A) - GYRUS   CIRCUMCISION ADULT (N/A) - CIRCUMCISION REVISION as a surgical intervention. Also discussed use of mitomycin-c in PACU. The patient's history has been reviewed, patient examined, no change in status, stable for surgery.  I have reviewed the patient's chart and labs. Cardiology clearance reviewed and appreciated. Discussed possible need for foley and / or ureteral stent. Discussed may attempt to take another rim of preputial off the glans circumferentially if possible to improve look of circumcision. Intense inflammation has left the preputial skin fused to the glans (present prior to circumcision). Questions were answered to the patient's satisfaction. He elects to proceed.    [name]

## 2014-05-11 NOTE — H&P (Signed)
History of Present Illness Patient seen today with a new issue. Gross hematuria.    1 - Gross hematuria-October 2015-pt reports he's had some episodes or red urine. One was around the time of his knee surgery and another time may have been after the circumcision. Urine appeared red. He was started on the Xarelto for paroxysmal atrial fibrillation and has undergone cardioversion. Per Dr. Einar Gip, he is presently maintaining sinus rhythm. His risk of stroke is extremely high due to cardiomyopathy, 4% risk of stroke on a yearly basis. Previously he has never had GI bleed or GU bleed.     Patient is a smoker. He denies exposure to chemotherapy or radiation. 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    I discussed with patient we did expect some hematuria prior to the circumcision as the urine was passing through the inflamed foreskin. However continued gross hematuria would be of concern. I discussed the nature risk and benefits of CT scan with IV contrast and cystoscopy in evaluating hematuria. He elected to proceed. Family then left the room.     2-squamous cell carcinoma, verrucous type - Jul 2015 - patient underwent circumcision. The deep margins were negative, lateral margin positive. Patient reports he is healed up well and noted no residual mass.       Past Medical History Problems  1. History of asthma (Z87.09) 2. History of atrial fibrillation (Z86.79) 3. History of cardiac arrhythmia (Z86.79) 4. History of cardiac murmur (Z86.79) 5. History of essential hypertension (Z86.79) 6. History of hypercholesterolemia (Z86.39)  Surgical History Problems  1. History of Circumcision No Clamp/Device/Dorsal Slit Older Than 28 Days 2. History of Excision Of Lesion Genitalia Malignant 2.1 To 3cm 3. History of Hand Surgery 4. History of Knee Replacement  Current Meds 1. Advair Diskus 250-50 MCG/DOSE Inhalation Aerosol Powder Breath Activated;  Therapy:  (Recorded:23Jun2015) to Recorded 2. Amiodarone HCl - 200 MG Oral Tablet;  Therapy: (Recorded:23Jun2015) to Recorded 3. Combivent AERO;  Therapy: (OZDGUYQI:[id number]) to Recorded 4. Diovan HCT 160-25 MG Oral Tablet;  Therapy: (LOVFIEPP:[id number]) to Recorded 5. Metoprolol Tartrate 25 MG Oral Tablet;  Therapy: (Recorded:23Jun2015) to Recorded 6. Pravastatin Sodium 80 MG Oral Tablet;  Therapy: (Recorded:23Jun2015) to Recorded 7. Spironolactone 25 MG Oral Tablet;  Therapy: (Recorded:23Jun2015) to Recorded 8. Xarelto 20 MG Oral Tablet;  Therapy: (Recorded:23Jun2015) to Recorded  Allergies Medication  1. No Known Drug Allergies  Family History Problems  1. Family history of cardiac disorder (Z82.49) : Mother, Brother 2. Family history of lung cancer (Z80.1) : Father  Social History Problems  1. Caffeine use (F15.90) 2. Former smoker [phone]) 3. Married 4. No alcohol use 5. Number of children 6. Retired  Engineer, site Vital Signs [Data Includes: Last 1 Day]  Recorded: 01Oct2015 12:37PM  Blood Pressure: 112 / 66 Temperature: 97 F Heart Rate: 62  Physical Exam Constitutional: Well nourished and well developed . No acute distress.   Pulmonary: No respiratory distress and normal respiratory rhythm and effort.   Cardiovascular: Heart rate and rhythm are normal . No peripheral edema.   Abdomen: The abdomen is soft and nontender. No masses are palpated. No CVA tenderness. No hernias are palpable. No hepatosplenomegaly noted.   Genitourinary: Examination of the penis demonstrates a ~Ucm mass, but no discharge, no lesions and a normal meatus. There a narrow band of abnormal foreskin on the right side from about 11:00 down to 5:00 at the edge of the normal penile skin. The penis is circumcised. The scrotum  is without lesions. The right epididymis is palpably normal and non-tender. The left epididymis is palpably normal and non-tender. The right testis is non-tender and without masses. The  left testis is non-tender and without masses.   Lymphatics: The femoral and inguinal nodes are not enlarged or tender.    Procedure  Procedure: Cystoscopy   Indication: Hematuria.  Informed Consent: Risks, benefits, and potential adverse events were discussed and informed consent was obtained from the patient.  Prep: The patient was prepped with betadine.  Antibiotic prophylaxis: Ciprofloxacin.  Procedure Note:  Urethral meatus:. No abnormalities.  Anterior urethra: No abnormalities.  Prostatic urethra: No abnormalities.  Bladder: Visulization was clear. The ureteral orifices were in the normal anatomic position bilaterally and had clear efflux of urine. A systematic survey of the bladder demonstrated no bladder tumors or stones. The mucosa was smooth without abnormalities. Examination of the bladder demonstrated trabeculation. A solitary tumor was visualized in the bladder. A papillary tumor was seen in the bladder. This tumor was located on the left side of the bladder. A couple of centimeters lateral to the left ureteral orifice. The patient tolerated the procedure well.  Complications: None.    Assessment Assessed  1. Penile cancer (C60.9) 2. Bladder neoplasm (D49.4)  Plan Bladder neoplasm  1. Follow-up Schedule Surgery Office  Follow-up  Status: Complete  Done: 01Oct2015 2. VENIPUNCTURE; Status:Complete;   Done: [id number] Health Maintenance  3. UA With REFLEX; [Do Not Release]; Status:Complete;   Done: [id number] 12:14PM  Discussion/Summary Bladder neoplasm-likely the source of the gross hematuria. Discussed with the patient and his wife and daughter the nature risks benefits and alternatives to TURBT with mitomycin-C instillation. He needs upper tract imaging. BUN/creatinine CT with IV contrast ordered. We also discussed the nature of bladder tumors to recur as well as the need for a staged/repeat procedure/biopsy on occasion to get the bladder neoplasm staging confirmed. We  discussed he may need a left ureteral stent.    Penile cancer - some residual abnormality of the foreskin on the right side from about 11:00 and 5:00. Discussed the major risks benefits alternatives to circumcision revision with excision of lesion. Now that he healed up from the prior circumcision is much easier to delineate normal from abnormal mucosa/foreskin. Again the prior foreskin and mucosal surface was fused to the edge of the glans. This will lead to some poorer cosmesis with further surgery but I do feel it necessary. No palpable adenopathy today.      Signatures Electronically signed by : [name], M.D.; Apr 16 2014  1:36PM EST

## 2014-05-11 NOTE — Op Note (Signed)
Preoperative diagnosis: Penile cancer, bladder neoplasm Postoperative diagnosis: Penile cancer, bladder neoplasm  Procedure: Exam under anesthesia Excision of penile cancer on preputial skin/circumcision revision TURBT 2-5 cm Mitomycin-C instilled in PACU  Surgeon: Junious Silk  Anesthesia: Gen.  Findings: On exam under anesthesia the preputial skin was still plastered to the proximal glans and coronal sulcus. On the right side of the penis there was residual verrucous carcinoma/wartlike lesion this was out distal and the preputial skin. There was no inguinal lymphadenopathy. On digital rectal exam the prostate was mildly enlarged but smooth without hard area or nodule.  On cystoscopy the urethra appeared normal. The prostatic urethra was normal. The bladder mucosa contain some erythematous mucosa on the right inferior bladder wall which was biopsied and fulgurated. There was a papillary tumor on the left bladder wall that was rather broad-based. This was resected completely although staging may need to be repeated. There was an obturator reflex and the patient had an LMA and could not be paralyzed. Therefore I had to be very brief with the cut on the tumor base. Hemostasis was excellent. There was severe bladder trabeculation. There are no stones or foreign bodies in the bladder. The ureteral orifices and trigone were normal. There was excellent efflux before and after all TUR.  Prescription procedure: After consent was obtained the patient brought to the operating room. After adequate anesthesia a thorough exam under anesthesia was performed. The external genitalia were prepped and draped in the usual sterile fashion. I carefully cleaned off the glans and the penis and it was evident that the verrucous carcinoma and preputial foreskin were still plastered to the proximal portion of the glans and the coronal sulcus in this plane would have to be developed in order to get a clean distal margin.  Therefore sharply went under the preputial skin and over the glans and was able to get into the correct preputial plane and get back to the coronal sulcus circumferentially. At times it was certainly difficult to find the plane but then I would hit pockets of sebum giving an indication I was in the correct layers although the true planes were obliterated.  There were a couple of patches where the glans mucosa stripped off such as in glans resurfacing. . Once the Glans was revealed and was not any residual tumor remaining on the glans. I then bluntly and sharply dissected behind the coronal sulcus into the dartos fascia circumferentially and elevated all the skin creating a new preputial incision circumferentially. I then went just proximal on the penile skin to the prior circumcision line and made a new circumferential incision which incorporated all of the verrucous carcinoma on the right side as well as all of the inflamed scarred preputial skin circumferentially. I sent some of the deep dartos under the verrucous carcinoma for the deep margin. I did label the proximal margin on the verrucous carcinoma foreskin specimen. Adequate hemostasis was insured. The glans and circumcision were clean with saline. The 12:00 and 6:00 locations were reapproximated with 4-0 Vicryl suture. The 3:00 and 9:00 were reapproximated with 4-0 Vicryl suture. This pinned the penile skin by the coronal sulcus hopefully keeping the penis from becoming buried. The remainder of the circumcision was reapproximated with interrupted 4-0 chromic suture. There was a split in the glans mucosa along the left side a few centimeters from the meatus from prior traction. This was reapproximated with 4 interrupted 4-0 Vicryl suture. The wound was then irrigated. The drapes were removed.  The patient was  then placed in lithotomy position. The exam under anesthesia/digital rectal exam was performed and I placed a B&O suppository. The genitalia were  reprepped and draped in the usual sterile fashion. Another timeout was performed covering the patient and procedure. The cystoscope was passed per urethra and the bladder carefully examined with a 12 and 70 lens. The 51 French resectoscope would not pass therefore the urethra was carefully dilated 20 [place] which was done so without any difficulty. The prior Vicryl repair of the glans was stable. The resectoscope was then passed with the visual obturator. The cold cup biopsy forceps were used to biopsy the right bladder wall. The resectoscope Endoloop was then passed in the right bladder biopsy fulgurated. Attention was then turned to the left bladder tumor. Was carefully resected down to the bladder wall. These chips were sent as bladder tumor. In the base was resected with the patient had a mild obturator reflex. From this point on was very quick with the cut and all the tumor was resected. The base was fulgurated. Hemostasis was excellent under low pressure. Both ureteral orifices continued to have clear efflux. The tumor base chips were sent separately. The bladder was refilled and the scope removed. An 74 French Foley catheter was placed in left gravity drainage.  The patient was cleaned up. Bacitracin ointment was placed into the meatus and glans penis. It was wrapped with a loose Vaseline gauze and then a loose kling dressing. The patient was awakened and taken to recovery room in stable condition.  Complications: None  Blood loss: Minimal  Drains: 18 French Foley  Specimens: #1 foreskin, proximal margin marked with a stitch #2 dartos fascia deep to the tumor #3 right bladder biopsy #4 bladder tumor chips #5 bladder tumor base  Disposition: Patient stable to PACU.

## 2014-05-11 NOTE — Anesthesia Preprocedure Evaluation (Addendum)
Anesthesia Evaluation  Patient identified by MRN, date of birth, ID band Patient awake    Reviewed: Allergy & Precautions, H&P , NPO status , Patient's Chart, lab work & pertinent test results, reviewed documented beta blocker date and time   Airway Mallampati: II  TM Distance: >3 FB Neck ROM: full    Dental  (+) Edentulous Upper, Edentulous Lower, Dental Advisory Given   Pulmonary shortness of breath and with exertion, asthma , COPDformer smoker,  Mild  Asthma and COPD breath sounds clear to auscultation  Pulmonary exam normal       Cardiovascular hypertension, Pt. on home beta blockers and Pt. on medications + dysrhythmias Atrial Fibrillation Rhythm:regular Rate:Normal  2 cardioversions. RBBB. Chronic diastolic cardiomyopathy   Neuro/Psych negative neurological ROS  negative psych ROS   GI/Hepatic negative GI ROS, Neg liver ROS,   Endo/Other  negative endocrine ROS  Renal/GU negative Renal ROS  negative genitourinary   Musculoskeletal  (+) Arthritis -, Osteoarthritis,    Abdominal   Peds  Hematology negative hematology ROS (+)   Anesthesia Other Findings   Reproductive/Obstetrics negative OB ROS                            Anesthesia Physical Anesthesia Plan  ASA: III  Anesthesia Plan: General   Post-op Pain Management:    Induction: Intravenous  Airway Management Planned: LMA  Additional Equipment:   Intra-op Plan:   Post-operative Plan:   Informed Consent: I have reviewed the patients History and Physical, chart, labs and discussed the procedure including the risks, benefits and alternatives for the proposed anesthesia with the patient or authorized representative who has indicated his/her understanding and acceptance.   Dental Advisory Given  Plan Discussed with: CRNA and Surgeon  Anesthesia Plan Comments:        Anesthesia Quick Evaluation

## 2014-05-11 NOTE — Anesthesia Postprocedure Evaluation (Signed)
  Anesthesia Post-op Note  Patient: Jerry Chase  Procedure(s) Performed: Procedure(s) (LRB): TRANSURETHRAL RESECTION OF BLADDER TUMOR WITH GYRUS (TURBT-GYRUS) (N/A) CIRCUMCISION ADULT (N/A)  Patient Location: PACU  Anesthesia Type: General  Level of Consciousness: awake and alert   Airway and Oxygen Therapy: Patient Spontanous Breathing  Post-op Pain: mild  Post-op Assessment: Post-op Vital signs reviewed, Patient's Cardiovascular Status Stable, Respiratory Function Stable, Patent Airway and No signs of Nausea or vomiting  Last Vitals:  Filed Vitals:   05/11/14 1330  BP:   Pulse: 79  Temp:   Resp: 19    Post-op Vital Signs: stable   Complications: No apparent anesthesia complications

## 2014-05-11 NOTE — Progress Notes (Signed)
Patient and family instructed on foley care, supplies given. Demonstrate return

## 2014-05-11 NOTE — Transfer of Care (Signed)
Immediate Anesthesia Transfer of Care Note  Patient: Jerry Chase  Procedure(s) Performed: Procedure(s) with comments: TRANSURETHRAL RESECTION OF BLADDER TUMOR WITH GYRUS (TURBT-GYRUS) (N/A) - GYRUS   CIRCUMCISION ADULT (N/A) - CIRCUMCISION REVISION  Patient Location: PACU  Anesthesia Type:General  Level of Consciousness: awake and alert   Airway & Oxygen Therapy: Patient Spontanous Breathing and Patient connected to face mask oxygen  Post-op Assessment: Report given to PACU RN and Post -op Vital signs reviewed and stable  Post vital signs: Reviewed and stable  Complications: No apparent anesthesia complications

## 2014-05-12 ENCOUNTER — Encounter (HOSPITAL_COMMUNITY): Payer: Self-pay | Admitting: Urology

## 2014-11-25 ENCOUNTER — Other Ambulatory Visit: Payer: Self-pay | Admitting: Gastroenterology

## 2015-08-27 ENCOUNTER — Emergency Department (HOSPITAL_COMMUNITY): Payer: Medicare Other

## 2015-08-27 ENCOUNTER — Encounter (HOSPITAL_COMMUNITY): Admission: EM | Disposition: E | Payer: Self-pay | Source: Home / Self Care | Attending: Internal Medicine

## 2015-08-27 ENCOUNTER — Inpatient Hospital Stay (HOSPITAL_COMMUNITY)
Admission: EM | Admit: 2015-08-27 | Discharge: 2015-09-14 | DRG: 286 | Disposition: E | Payer: Medicare Other | Attending: Internal Medicine | Admitting: Internal Medicine

## 2015-08-27 DIAGNOSIS — N179 Acute kidney failure, unspecified: Secondary | ICD-10-CM | POA: Diagnosis present

## 2015-08-27 DIAGNOSIS — E872 Acidosis: Secondary | ICD-10-CM | POA: Diagnosis not present

## 2015-08-27 DIAGNOSIS — Z9221 Personal history of antineoplastic chemotherapy: Secondary | ICD-10-CM | POA: Diagnosis not present

## 2015-08-27 DIAGNOSIS — J9600 Acute respiratory failure, unspecified whether with hypoxia or hypercapnia: Secondary | ICD-10-CM | POA: Diagnosis not present

## 2015-08-27 DIAGNOSIS — I4901 Ventricular fibrillation: Principal | ICD-10-CM | POA: Diagnosis present

## 2015-08-27 DIAGNOSIS — J9601 Acute respiratory failure with hypoxia: Secondary | ICD-10-CM

## 2015-08-27 DIAGNOSIS — J189 Pneumonia, unspecified organism: Secondary | ICD-10-CM

## 2015-08-27 DIAGNOSIS — Z87891 Personal history of nicotine dependence: Secondary | ICD-10-CM | POA: Diagnosis not present

## 2015-08-27 DIAGNOSIS — B961 Klebsiella pneumoniae [K. pneumoniae] as the cause of diseases classified elsewhere: Secondary | ICD-10-CM | POA: Diagnosis present

## 2015-08-27 DIAGNOSIS — R918 Other nonspecific abnormal finding of lung field: Secondary | ICD-10-CM | POA: Diagnosis present

## 2015-08-27 DIAGNOSIS — N390 Urinary tract infection, site not specified: Secondary | ICD-10-CM | POA: Diagnosis present

## 2015-08-27 DIAGNOSIS — J154 Pneumonia due to other streptococci: Secondary | ICD-10-CM | POA: Diagnosis not present

## 2015-08-27 DIAGNOSIS — E876 Hypokalemia: Secondary | ICD-10-CM | POA: Diagnosis not present

## 2015-08-27 DIAGNOSIS — I469 Cardiac arrest, cause unspecified: Secondary | ICD-10-CM | POA: Diagnosis present

## 2015-08-27 DIAGNOSIS — D649 Anemia, unspecified: Secondary | ICD-10-CM | POA: Diagnosis not present

## 2015-08-27 DIAGNOSIS — E1165 Type 2 diabetes mellitus with hyperglycemia: Secondary | ICD-10-CM | POA: Diagnosis present

## 2015-08-27 DIAGNOSIS — A419 Sepsis, unspecified organism: Secondary | ICD-10-CM | POA: Diagnosis not present

## 2015-08-27 DIAGNOSIS — I130 Hypertensive heart and chronic kidney disease with heart failure and stage 1 through stage 4 chronic kidney disease, or unspecified chronic kidney disease: Secondary | ICD-10-CM | POA: Diagnosis present

## 2015-08-27 DIAGNOSIS — G931 Anoxic brain damage, not elsewhere classified: Secondary | ICD-10-CM | POA: Diagnosis not present

## 2015-08-27 DIAGNOSIS — I959 Hypotension, unspecified: Secondary | ICD-10-CM | POA: Diagnosis not present

## 2015-08-27 DIAGNOSIS — E1122 Type 2 diabetes mellitus with diabetic chronic kidney disease: Secondary | ICD-10-CM | POA: Diagnosis not present

## 2015-08-27 DIAGNOSIS — J440 Chronic obstructive pulmonary disease with acute lower respiratory infection: Secondary | ICD-10-CM | POA: Diagnosis not present

## 2015-08-27 DIAGNOSIS — Z66 Do not resuscitate: Secondary | ICD-10-CM | POA: Diagnosis not present

## 2015-08-27 DIAGNOSIS — N183 Chronic kidney disease, stage 3 (moderate): Secondary | ICD-10-CM | POA: Diagnosis present

## 2015-08-27 DIAGNOSIS — Z79899 Other long term (current) drug therapy: Secondary | ICD-10-CM | POA: Diagnosis not present

## 2015-08-27 DIAGNOSIS — I420 Dilated cardiomyopathy: Secondary | ICD-10-CM | POA: Diagnosis present

## 2015-08-27 DIAGNOSIS — I4510 Unspecified right bundle-branch block: Secondary | ICD-10-CM | POA: Diagnosis present

## 2015-08-27 DIAGNOSIS — Z23 Encounter for immunization: Secondary | ICD-10-CM | POA: Diagnosis not present

## 2015-08-27 DIAGNOSIS — J810 Acute pulmonary edema: Secondary | ICD-10-CM | POA: Diagnosis not present

## 2015-08-27 DIAGNOSIS — I482 Chronic atrial fibrillation: Secondary | ICD-10-CM | POA: Diagnosis present

## 2015-08-27 DIAGNOSIS — I2510 Atherosclerotic heart disease of native coronary artery without angina pectoris: Secondary | ICD-10-CM | POA: Diagnosis present

## 2015-08-27 DIAGNOSIS — R34 Anuria and oliguria: Secondary | ICD-10-CM | POA: Diagnosis not present

## 2015-08-27 DIAGNOSIS — E785 Hyperlipidemia, unspecified: Secondary | ICD-10-CM | POA: Diagnosis present

## 2015-08-27 DIAGNOSIS — Z515 Encounter for palliative care: Secondary | ICD-10-CM

## 2015-08-27 DIAGNOSIS — R570 Cardiogenic shock: Secondary | ICD-10-CM | POA: Diagnosis not present

## 2015-08-27 DIAGNOSIS — G9340 Encephalopathy, unspecified: Secondary | ICD-10-CM | POA: Diagnosis not present

## 2015-08-27 DIAGNOSIS — I255 Ischemic cardiomyopathy: Secondary | ICD-10-CM | POA: Diagnosis present

## 2015-08-27 DIAGNOSIS — I509 Heart failure, unspecified: Secondary | ICD-10-CM | POA: Diagnosis not present

## 2015-08-27 DIAGNOSIS — Z7901 Long term (current) use of anticoagulants: Secondary | ICD-10-CM | POA: Diagnosis not present

## 2015-08-27 DIAGNOSIS — R402432 Glasgow coma scale score 3-8, at arrival to emergency department: Secondary | ICD-10-CM | POA: Diagnosis present

## 2015-08-27 DIAGNOSIS — I5043 Acute on chronic combined systolic (congestive) and diastolic (congestive) heart failure: Secondary | ICD-10-CM | POA: Diagnosis present

## 2015-08-27 DIAGNOSIS — Z9289 Personal history of other medical treatment: Secondary | ICD-10-CM

## 2015-08-27 HISTORY — PX: CARDIAC CATHETERIZATION: SHX172

## 2015-08-27 HISTORY — DX: Atherosclerotic heart disease of native coronary artery without angina pectoris: I25.10

## 2015-08-27 HISTORY — DX: Chronic kidney disease, unspecified: N18.9

## 2015-08-27 LAB — I-STAT CHEM 8, ED
BUN: 27 mg/dL — AB (ref 6–20)
CHLORIDE: 105 mmol/L (ref 101–111)
Calcium, Ion: 1.45 mmol/L — ABNORMAL HIGH (ref 1.13–1.30)
Creatinine, Ser: 1.2 mg/dL (ref 0.61–1.24)
Glucose, Bld: 271 mg/dL — ABNORMAL HIGH (ref 65–99)
HCT: 39 % (ref 39.0–52.0)
Hemoglobin: 13.3 g/dL (ref 13.0–17.0)
POTASSIUM: 3.5 mmol/L (ref 3.5–5.1)
SODIUM: 139 mmol/L (ref 135–145)
TCO2: 20 mmol/L (ref 0–100)

## 2015-08-27 LAB — GLUCOSE, CAPILLARY
GLUCOSE-CAPILLARY: 179 mg/dL — AB (ref 65–99)
GLUCOSE-CAPILLARY: 245 mg/dL — AB (ref 65–99)
Glucose-Capillary: 219 mg/dL — ABNORMAL HIGH (ref 65–99)
Glucose-Capillary: 180 mg/dL — ABNORMAL HIGH (ref 65–99)
Glucose-Capillary: 208 mg/dL — ABNORMAL HIGH (ref 65–99)
Glucose-Capillary: 226 mg/dL — ABNORMAL HIGH (ref 65–99)
Glucose-Capillary: 212 mg/dL — ABNORMAL HIGH (ref 65–99)

## 2015-08-27 LAB — I-STAT VENOUS BLOOD GAS, ED
Acid-base deficit: 11 mmol/L — ABNORMAL HIGH (ref 0.0–2.0)
Bicarbonate: 18.2 mEq/L — ABNORMAL LOW (ref 20.0–24.0)
O2 SAT: 97 %
PH VEN: 7.119 — AB (ref 7.250–7.300)
PO2 VEN: 122 mmHg — AB (ref 30.0–45.0)
TCO2: 20 mmol/L (ref 0–100)
pCO2, Ven: 56.2 mmHg — ABNORMAL HIGH (ref 45.0–50.0)

## 2015-08-27 LAB — CBC WITH DIFFERENTIAL/PLATELET
BASOS PCT: 1 %
Basophils Absolute: 0.1 10*3/uL (ref 0.0–0.1)
EOS ABS: 0.2 10*3/uL (ref 0.0–0.7)
EOS PCT: 2 %
HEMATOCRIT: 37.5 % — AB (ref 39.0–52.0)
HEMOGLOBIN: 11.8 g/dL — AB (ref 13.0–17.0)
LYMPHS PCT: 30 %
Lymphs Abs: 3.7 10*3/uL (ref 0.7–4.0)
MCH: 29.5 pg (ref 26.0–34.0)
MCHC: 31.5 g/dL (ref 30.0–36.0)
MCV: 93.8 fL (ref 78.0–100.0)
MONOS PCT: 4 %
Monocytes Absolute: 0.5 10*3/uL (ref 0.1–1.0)
NEUTROS PCT: 63 %
Neutro Abs: 7.7 10*3/uL (ref 1.7–7.7)
Platelets: 335 10*3/uL (ref 150–400)
RBC: 4 MIL/uL — ABNORMAL LOW (ref 4.22–5.81)
RDW: 13.2 % (ref 11.5–15.5)
WBC: 12.2 10*3/uL — ABNORMAL HIGH (ref 4.0–10.5)

## 2015-08-27 LAB — POCT I-STAT 3, ART BLOOD GAS (G3+)
Acid-base deficit: 6 mmol/L — ABNORMAL HIGH (ref 0.0–2.0)
Bicarbonate: 18 mEq/L — ABNORMAL LOW (ref 20.0–24.0)
O2 Saturation: 100 %
PCO2 ART: 24.7 mmHg — AB (ref 35.0–45.0)
TCO2: 19 mmol/L (ref 0–100)
pH, Arterial: 7.451 — ABNORMAL HIGH (ref 7.350–7.450)
pO2, Arterial: 344 mmHg — ABNORMAL HIGH (ref 80.0–100.0)

## 2015-08-27 LAB — BASIC METABOLIC PANEL
ANION GAP: 16 — AB (ref 5–15)
ANION GAP: 17 — AB (ref 5–15)
Anion gap: 16 — ABNORMAL HIGH (ref 5–15)
BUN: 25 mg/dL — ABNORMAL HIGH (ref 6–20)
BUN: 26 mg/dL — ABNORMAL HIGH (ref 6–20)
BUN: 27 mg/dL — ABNORMAL HIGH (ref 6–20)
CALCIUM: 9.1 mg/dL (ref 8.9–10.3)
CALCIUM: 9.1 mg/dL (ref 8.9–10.3)
CALCIUM: 9.1 mg/dL (ref 8.9–10.3)
CHLORIDE: 103 mmol/L (ref 101–111)
CO2: 17 mmol/L — ABNORMAL LOW (ref 22–32)
CO2: 17 mmol/L — AB (ref 22–32)
CO2: 18 mmol/L — ABNORMAL LOW (ref 22–32)
CREATININE: 1.56 mg/dL — AB (ref 0.61–1.24)
CREATININE: 1.74 mg/dL — AB (ref 0.61–1.24)
CREATININE: 1.79 mg/dL — AB (ref 0.61–1.24)
Chloride: 104 mmol/L (ref 101–111)
Chloride: 102 mmol/L (ref 101–111)
GFR, EST AFRICAN AMERICAN: 47 mL/min — AB (ref >60)
GFR, EST AFRICAN AMERICAN: 41 mL/min — AB (ref >60)
GFR, EST AFRICAN AMERICAN: 40 mL/min — AB (ref >60)
GFR, EST NON AFRICAN AMERICAN: 41 mL/min — AB (ref >60)
GFR, EST NON AFRICAN AMERICAN: 36 mL/min — AB (ref >60)
GFR, EST NON AFRICAN AMERICAN: 34 mL/min — AB (ref >60)
GLUCOSE: 272 mg/dL — AB (ref 65–99)
Glucose, Bld: 277 mg/dL — ABNORMAL HIGH (ref 65–99)
Glucose, Bld: 223 mg/dL — ABNORMAL HIGH (ref 65–99)
Potassium: 3.7 mmol/L (ref 3.5–5.1)
Potassium: 3.5 mmol/L (ref 3.5–5.1)
Potassium: 3.1 mmol/L — ABNORMAL LOW (ref 3.5–5.1)
SODIUM: 137 mmol/L (ref 135–145)
Sodium: 137 mmol/L (ref 135–145)
Sodium: 136 mmol/L (ref 135–145)

## 2015-08-27 LAB — TROPONIN I
TROPONIN I: 0.31 ng/mL — AB (ref <0.031)
Troponin I: 0.34 ng/mL — ABNORMAL HIGH (ref <0.031)

## 2015-08-27 LAB — MRSA PCR SCREENING: MRSA by PCR: NEGATIVE

## 2015-08-27 LAB — LACTIC ACID, PLASMA
LACTIC ACID, VENOUS: 3.1 mmol/L — AB (ref 0.5–2.0)
Lactic Acid, Venous: 3.2 mmol/L (ref 0.5–2.0)

## 2015-08-27 LAB — I-STAT CG4 LACTIC ACID, ED: Lactic Acid, Venous: 8.29 mmol/L (ref 0.5–2.0)

## 2015-08-27 LAB — PROTIME-INR
INR: 1.78 — AB (ref 0.00–1.49)
INR: 1.6 — AB (ref 0.00–1.49)
Prothrombin Time: 20.7 seconds — ABNORMAL HIGH (ref 11.6–15.2)
Prothrombin Time: 19.1 seconds — ABNORMAL HIGH (ref 11.6–15.2)

## 2015-08-27 LAB — APTT
APTT: 29 s (ref 24–37)
APTT: 41 s — AB (ref 24–37)
aPTT: 91 seconds — ABNORMAL HIGH (ref 24–37)

## 2015-08-27 LAB — MAGNESIUM: Magnesium: 2 mg/dL (ref 1.7–2.4)

## 2015-08-27 LAB — HEPARIN LEVEL (UNFRACTIONATED): Heparin Unfractionated: >2.2 IU/mL — ABNORMAL HIGH (ref 0.30–0.70)

## 2015-08-27 LAB — I-STAT TROPONIN, ED: Troponin i, poc: 0.02 ng/mL (ref 0.00–0.08)

## 2015-08-27 LAB — PHOSPHORUS: Phosphorus: 6.7 mg/dL — ABNORMAL HIGH (ref 2.5–4.6)

## 2015-08-27 SURGERY — LEFT HEART CATH AND CORONARY ANGIOGRAPHY
Anesthesia: LOCAL

## 2015-08-27 MED ORDER — MIDAZOLAM HCL 2 MG/2ML IJ SOLN: 2.0000 mg | Freq: Once | INTRAMUSCULAR | Status: DC | PRN

## 2015-08-27 MED ORDER — HEPARIN (PORCINE) IN NACL 100-0.45 UNIT/ML-% IJ SOLN
800.0000 [IU]/h | INTRAMUSCULAR | Status: DC
  Administered 2015-08-27: 750 [IU]/h via INTRAVENOUS
  Administered 2015-08-28: 800 [IU]/h via INTRAVENOUS
  Filled 2015-08-27 (×2): qty 250

## 2015-08-27 MED ORDER — AMIODARONE HCL 150 MG/3ML IV SOLN
150.0000 mg | INTRAVENOUS | Status: DC | PRN
  Administered 2015-08-27: 150 mg via INTRAVENOUS

## 2015-08-27 MED ORDER — SODIUM CHLORIDE 0.9 % IV SOLN: 250.0000 mL | INTRAVENOUS | Status: DC | PRN

## 2015-08-27 MED ORDER — CALCIUM CHLORIDE 10 % IV SOLN
INTRAVENOUS | Status: DC | PRN
  Administered 2015-08-27: 1 g via INTRAVENOUS

## 2015-08-27 MED ORDER — EPINEPHRINE HCL 0.1 MG/ML IJ SOSY
PREFILLED_SYRINGE | INTRAMUSCULAR | Status: AC | PRN
  Administered 2015-08-27: 0.1 mg via INTRAVENOUS

## 2015-08-27 MED ORDER — CISATRACURIUM BOLUS VIA INFUSION
5.0000 mg | Freq: Once | INTRAVENOUS | Status: DC
  Filled 2015-08-27: qty 5

## 2015-08-27 MED ORDER — SODIUM CHLORIDE 0.9% FLUSH: 3.0000 mL | INTRAVENOUS | Status: DC | PRN

## 2015-08-27 MED ORDER — SODIUM CHLORIDE 0.9 % IV SOLN
25.0000 ug/h | INTRAVENOUS | Status: DC
  Filled 2015-08-27 (×2): qty 50

## 2015-08-27 MED ORDER — METOPROLOL TARTRATE 1 MG/ML IV SOLN
5.0000 mg | Freq: Four times a day (QID) | INTRAVENOUS | Status: DC
  Administered 2015-08-27 – 2015-08-31 (×8): 5 mg via INTRAVENOUS
  Filled 2015-08-27 (×9): qty 5

## 2015-08-27 MED ORDER — SODIUM CHLORIDE 0.9% FLUSH
3.0000 mL | Freq: Two times a day (BID) | INTRAVENOUS | Status: DC
  Administered 2015-08-28 – 2015-09-03 (×6): 3 mL via INTRAVENOUS

## 2015-08-27 MED ORDER — MIDAZOLAM HCL 2 MG/2ML IJ SOLN: 1.0000 mg | Freq: Once | INTRAMUSCULAR | Status: DC

## 2015-08-27 MED ORDER — SODIUM CHLORIDE 0.9 % IV SOLN
1.0000 ug/kg/min | INTRAVENOUS | Status: DC
  Administered 2015-08-27 (×2): 1 ug/kg/min via INTRAVENOUS
  Administered 2015-08-28: 1.5 ug/kg/min via INTRAVENOUS
  Filled 2015-08-27 (×3): qty 20

## 2015-08-27 MED ORDER — SODIUM CHLORIDE 0.9 % IV SOLN
25.0000 ug/h | INTRAVENOUS | Status: DC
  Administered 2015-08-27: 50 ug/h via INTRAVENOUS
  Administered 2015-08-28: 125 ug/h via INTRAVENOUS
  Administered 2015-08-30: 175 ug/h via INTRAVENOUS
  Filled 2015-08-27 (×4): qty 50

## 2015-08-27 MED ORDER — HEPARIN (PORCINE) IN NACL 2-0.9 UNIT/ML-% IJ SOLN
INTRAMUSCULAR | Status: DC | PRN
  Administered 2015-08-27: 15:00:00

## 2015-08-27 MED ORDER — SODIUM CHLORIDE 0.9 % IV SOLN
INTRAVENOUS | Status: DC | PRN
  Administered 2015-08-27: 1000 mL via INTRAVENOUS

## 2015-08-27 MED ORDER — LIDOCAINE HCL (PF) 1 % IJ SOLN
INTRAMUSCULAR | Status: AC
  Filled 2015-08-27: qty 30

## 2015-08-27 MED ORDER — FENTANYL CITRATE (PF) 100 MCG/2ML IJ SOLN: 50.0000 ug | Freq: Once | INTRAMUSCULAR | Status: DC

## 2015-08-27 MED ORDER — SODIUM CHLORIDE 0.9% FLUSH: 10.0000 mL | INTRAVENOUS | Status: DC | PRN

## 2015-08-27 MED ORDER — MIDAZOLAM BOLUS VIA INFUSION
2.0000 mg | INTRAVENOUS | Status: DC | PRN
  Filled 2015-08-27: qty 2

## 2015-08-27 MED ORDER — SODIUM CHLORIDE 0.9 % IV SOLN
INTRAVENOUS | Status: DC | PRN
  Administered 2015-08-27: 10 mL/h via INTRAVENOUS

## 2015-08-27 MED ORDER — ASPIRIN 300 MG RE SUPP: 300.0000 mg | RECTAL | Status: AC

## 2015-08-27 MED ORDER — HEPARIN SODIUM (PORCINE) 1000 UNIT/ML IJ SOLN
INTRAMUSCULAR | Status: AC
  Filled 2015-08-27: qty 1

## 2015-08-27 MED ORDER — ASPIRIN 300 MG RE SUPP
300.0000 mg | Freq: Once | RECTAL | Status: DC
  Filled 2015-08-27: qty 1

## 2015-08-27 MED ORDER — FENTANYL CITRATE (PF) 100 MCG/2ML IJ SOLN: 100.0000 ug | Freq: Once | INTRAMUSCULAR | Status: DC | PRN

## 2015-08-27 MED ORDER — CISATRACURIUM BOLUS VIA INFUSION
0.0500 mg/kg | INTRAVENOUS | Status: DC | PRN
  Filled 2015-08-27: qty 7

## 2015-08-27 MED ORDER — SODIUM CHLORIDE 0.9 % IV SOLN
INTRAVENOUS | Status: AC
  Administered 2015-08-27: 16:00:00 via INTRAVENOUS

## 2015-08-27 MED ORDER — SODIUM CHLORIDE 0.9 % IV SOLN
30.0000 ug/h | INTRAVENOUS | Status: DC
  Filled 2015-08-27: qty 50

## 2015-08-27 MED ORDER — CHLORHEXIDINE GLUCONATE 0.12% ORAL RINSE (MEDLINE KIT)
15.0000 mL | Freq: Two times a day (BID) | OROMUCOSAL | Status: DC
  Administered 2015-08-27 – 2015-09-04 (×15): 15 mL via OROMUCOSAL

## 2015-08-27 MED ORDER — SODIUM CHLORIDE 0.9 % IV SOLN
INTRAVENOUS | Status: DC
  Administered 2015-08-27 – 2015-09-01 (×3): via INTRAVENOUS

## 2015-08-27 MED ORDER — ATORVASTATIN CALCIUM 80 MG PO TABS
80.0000 mg | ORAL_TABLET | Freq: Every day | ORAL | Status: DC
  Administered 2015-08-27 – 2015-09-03 (×8): 80 mg via ORAL
  Filled 2015-08-27 (×8): qty 1

## 2015-08-27 MED ORDER — VERAPAMIL HCL 2.5 MG/ML IV SOLN
INTRA_ARTERIAL | Status: DC | PRN
  Administered 2015-08-27: 7.5 mL via INTRA_ARTERIAL

## 2015-08-27 MED ORDER — SODIUM CHLORIDE 0.9 % IV SOLN
1.0000 mg/h | INTRAVENOUS | Status: DC
  Filled 2015-08-27: qty 10

## 2015-08-27 MED ORDER — NITROGLYCERIN 1 MG/10 ML FOR IR/CATH LAB
INTRA_ARTERIAL | Status: AC
  Filled 2015-08-27: qty 10

## 2015-08-27 MED ORDER — HEPARIN SODIUM (PORCINE) 5000 UNIT/ML IJ SOLN: 5000.0000 [IU] | Freq: Three times a day (TID) | INTRAMUSCULAR | Status: DC

## 2015-08-27 MED ORDER — NOREPINEPHRINE BITARTRATE 1 MG/ML IV SOLN
0.0000 ug/min | INTRAVENOUS | Status: DC
  Administered 2015-08-28: 5 ug/min via INTRAVENOUS
  Administered 2015-08-28: 2 ug/min via INTRAVENOUS
  Administered 2015-08-29: 3 ug/min via INTRAVENOUS
  Filled 2015-08-27 (×5): qty 4

## 2015-08-27 MED ORDER — HEPARIN SODIUM (PORCINE) 1000 UNIT/ML IJ SOLN
INTRAMUSCULAR | Status: DC | PRN
  Administered 2015-08-27: 5000 [IU] via INTRAVENOUS

## 2015-08-27 MED ORDER — CISATRACURIUM BOLUS VIA INFUSION
0.1000 mg/kg | Freq: Once | INTRAVENOUS | Status: DC
  Filled 2015-08-27: qty 13

## 2015-08-27 MED ORDER — MIDAZOLAM BOLUS VIA INFUSION
1.0000 mg | INTRAVENOUS | Status: DC | PRN
  Filled 2015-08-27: qty 1

## 2015-08-27 MED ORDER — PANTOPRAZOLE SODIUM 40 MG IV SOLR
40.0000 mg | INTRAVENOUS | Status: DC
  Administered 2015-08-27 – 2015-08-30 (×4): 40 mg via INTRAVENOUS
  Filled 2015-08-27 (×4): qty 40

## 2015-08-27 MED ORDER — INSULIN ASPART 100 UNIT/ML ~~LOC~~ SOLN
0.0000 [IU] | SUBCUTANEOUS | Status: DC
  Administered 2015-08-27: 3 [IU] via SUBCUTANEOUS
  Administered 2015-08-27: 2 [IU] via SUBCUTANEOUS
  Administered 2015-08-28: 1 [IU] via SUBCUTANEOUS
  Administered 2015-08-28 (×2): 2 [IU] via SUBCUTANEOUS
  Administered 2015-08-30 – 2015-09-04 (×20): 1 [IU] via SUBCUTANEOUS

## 2015-08-27 MED ORDER — ARTIFICIAL TEARS OP OINT
1.0000 "application " | TOPICAL_OINTMENT | Freq: Three times a day (TID) | OPHTHALMIC | Status: DC
  Administered 2015-08-27 – 2015-08-29 (×5): 1 via OPHTHALMIC
  Filled 2015-08-27: qty 3.5

## 2015-08-27 MED ORDER — LIDOCAINE HCL (PF) 1 % IJ SOLN
INTRAMUSCULAR | Status: DC | PRN
  Administered 2015-08-27: 2 mL

## 2015-08-27 MED ORDER — FENTANYL BOLUS VIA INFUSION
25.0000 ug | INTRAVENOUS | Status: DC | PRN
  Filled 2015-08-27: qty 25

## 2015-08-27 MED ORDER — SODIUM CHLORIDE 0.9 % IV SOLN
3.0000 ug/kg/min | INTRAVENOUS | Status: DC
  Administered 2015-08-27: 1 ug/kg/min via INTRAVENOUS
  Filled 2015-08-27 (×2): qty 20

## 2015-08-27 MED ORDER — MIDAZOLAM HCL 5 MG/ML IJ SOLN
1.0000 mg/h | INTRAMUSCULAR | Status: DC
  Filled 2015-08-27 (×2): qty 10

## 2015-08-27 MED ORDER — ANTISEPTIC ORAL RINSE SOLUTION (CORINZ)
7.0000 mL | OROMUCOSAL | Status: DC
  Administered 2015-08-27 – 2015-09-04 (×80): 7 mL via OROMUCOSAL

## 2015-08-27 MED ORDER — AMIODARONE HCL IN DEXTROSE 360-4.14 MG/200ML-% IV SOLN
INTRAVENOUS | Status: AC
  Filled 2015-08-27: qty 200

## 2015-08-27 MED ORDER — SODIUM CHLORIDE 0.9 % IV SOLN: 2000.0000 mL | Freq: Once | INTRAVENOUS | Status: DC

## 2015-08-27 MED ORDER — IOHEXOL 350 MG/ML SOLN
INTRAVENOUS | Status: DC | PRN
  Administered 2015-08-27: 55 mL via INTRAVENOUS

## 2015-08-27 MED ORDER — MIDAZOLAM HCL 2 MG/2ML IJ SOLN: 2.0000 mg | Freq: Once | INTRAMUSCULAR | Status: DC

## 2015-08-27 MED ORDER — FENTANYL CITRATE (PF) 100 MCG/2ML IJ SOLN: 100.0000 ug | Freq: Once | INTRAMUSCULAR | Status: DC

## 2015-08-27 MED ORDER — FENTANYL BOLUS VIA INFUSION
50.0000 ug | INTRAVENOUS | Status: DC | PRN
  Filled 2015-08-27: qty 50

## 2015-08-27 MED ORDER — SODIUM CHLORIDE 0.9 % IV SOLN
1.0000 mg/h | INTRAVENOUS | Status: DC
  Administered 2015-08-27 – 2015-08-29 (×4): 2 mg/h via INTRAVENOUS
  Filled 2015-08-27 (×4): qty 10

## 2015-08-27 MED ORDER — HEPARIN (PORCINE) IN NACL 2-0.9 UNIT/ML-% IJ SOLN
INTRAMUSCULAR | Status: AC
  Filled 2015-08-27: qty 1000

## 2015-08-27 MED ORDER — VERAPAMIL HCL 2.5 MG/ML IV SOLN
INTRAVENOUS | Status: AC
  Filled 2015-08-27: qty 2

## 2015-08-27 MED ORDER — ASPIRIN 81 MG PO CHEW
81.0000 mg | CHEWABLE_TABLET | Freq: Every day | ORAL | Status: DC
  Administered 2015-08-28 – 2015-09-04 (×8): 81 mg via ORAL
  Filled 2015-08-27 (×8): qty 1

## 2015-08-27 MED ORDER — SODIUM CHLORIDE 0.9% FLUSH
10.0000 mL | Freq: Two times a day (BID) | INTRAVENOUS | Status: DC
  Administered 2015-08-27: 10 mL
  Administered 2015-08-27: 30 mL
  Administered 2015-08-28 – 2015-08-31 (×5): 10 mL
  Administered 2015-08-31 – 2015-09-01 (×2): 30 mL
  Administered 2015-09-02 – 2015-09-03 (×3): 10 mL

## 2015-08-27 SURGICAL SUPPLY — 9 items
CATH OPTITORQUE TIG 4.0 5F (CATHETERS) ×2 IMPLANT
DEVICE RAD COMP TR BAND LRG (VASCULAR PRODUCTS) ×2 IMPLANT
GLIDESHEATH SLEND A-KIT 6F 20G (SHEATH) ×2 IMPLANT
HOVERMATT SINGLE USE (MISCELLANEOUS) ×2 IMPLANT
KIT HEART LEFT (KITS) ×2 IMPLANT
PACK CARDIAC CATHETERIZATION (CUSTOM PROCEDURE TRAY) ×2 IMPLANT
TRANSDUCER W/STOPCOCK (MISCELLANEOUS) ×2 IMPLANT
TUBING CIL FLEX 10 FLL-RA (TUBING) ×2 IMPLANT
WIRE SAFE-T 1.5MM-J .035X260CM (WIRE) ×2 IMPLANT
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
## 2015-08-27 NOTE — Progress Notes (Signed)
Art line placement attempts unsuccessful.  4 attempts made by 2 RT's.

## 2015-08-27 NOTE — Code Documentation (Signed)
DrMarland Kitchen [place] and Dr. Billy Fischer speaking with the family.  They are present in the family room with the chaplain

## 2015-08-27 NOTE — ED Notes (Signed)
Page chaplain for possible family in the waiting room.

## 2015-08-27 NOTE — Code Documentation (Signed)
Pt.s belongings given to the Family by the [facility]

## 2015-08-27 NOTE — ED Notes (Signed)
Paged chaplain for possible family in the waiting room.

## 2015-08-27 NOTE — Progress Notes (Signed)
ANTICOAGULATION CONSULT NOTE - Initial Consult  Pharmacy Consult for heparin Indication: chest pain/ACS and atrial fibrillation  Allergies not on file  Patient Measurements: Weight: 280 lb (127.007 kg) Heparin Dosing Weight: 96 kg  Vital Signs: Temp: 89.4 F (31.9 C) (02/11 1900) Temp Source: Core (Comment) (02/11 1900) BP: 110/79 mmHg (02/11 1930) Pulse Rate: 77 (02/11 1930)  Labs:  Recent Labs  09/09/2015 1255 08/19/2015 1348 09/07/2015 1550 08/24/2015 1800  HGB 11.8* 13.3  --   --   HCT 37.5* 39.0  --   --   PLT 335  --   --   --   APTT  --   --  91* 29  LABPROT  --   --  20.7*  --   INR  --   --  1.78*  --   HEPARINUNFRC  --   --   --  >2.20*  CREATININE  --  1.20  --  1.56*  TROPONINI  --   --   --  0.31*    CrCl cannot be calculated (Unknown ideal weight.).   Medical History: No past medical history on file.  Assessment: 42 YOM admitted 08/31/2015 after cardiac arrest. s/p PCI w/ angiography. CPr started EMS found him in Vfib, shocked x 4 , 1 epi , rosc.  Tx to [facility] ED , Vfib, shocked and epi with rosc and intubated. Placed on hypothermia protocol and taken urgently to cath lab.   PMH: Afib, HTN, HLD, COPD, bladder tumor/cancer   Anticoagulation: None PTA, Hep SQ for DVT ppx   Cardiovascular: witnessed Vfib cardiac arrest. CPR immediately started. Received 4 shocks and 1 epi with ROSC. S/P radial PCI angiography. Hypothermia protocol (started cooling 2/11 around 1400) Amiodarone drip  Nephrology: SCr 1.20, Phos 6.7, other lytes ok   Hematology / Oncology: Hgb 13.3, PLT 335, no bleeding   Sheath removed at 1440 - start heparin 6 hrs after for ACS/A fib. Patient on rivaroxaban at home, so will need to follow aPTT levels until they correlate with HL.   Goal of Therapy:  Heparin level 0.3-0.7 units/ml Monitor platelets by anticoagulation protocol: Yes   Plan:  Heparin 750 units/hr @ 2100 (lower due to code cool) Initial aPTT @0200  Daily HL, CBC Monitor  levels more closely while pt is CODE COOL  Levester Fresh, PharmD, BCPS, [facility] Clinical Pharmacist Pager [phone] 08/26/2015 7:47 PM

## 2015-08-27 NOTE — Progress Notes (Signed)
   08/26/2015 1900  Clinical Encounter Type  Visited With Family;Health care provider  Visit Type ED;Critical Care;Code  Referral From Nurse  Spiritual Encounters  Spiritual Needs Emotional  CH met family and escorted to consult B to meet with CC MD, Cardiology MD and ED MD; Then [facility] escorted family to 2nd fllor Cath Lab waiting area; [facility] offered support as needed.

## 2015-08-27 NOTE — Procedures (Signed)
Arterial Catheter Insertion Procedure Note Jerry Chase [id number] Jul 28, 1934  Procedure: Insertion of Arterial Catheter  Indications: Blood pressure monitoring  Procedure Details Consent: Unable to obtain consent because of altered level of consciousness. Time Out: Verified patient identification, verified procedure, site/side was marked, verified correct patient position, special equipment/implants available, medications/allergies/relevent history reviewed, required imaging and test results available.  Performed  Maximum sterile technique was used including antiseptics, cap, gloves, gown, hand hygiene, mask and sheet. Skin prep: Chlorhexidine; local anesthetic administered 20 gauge catheter was inserted into right radial artery using the Seldinger technique.  Evaluation Blood flow good; BP tracing good. Complications: No apparent complications.   Jerry Chase 08/24/2015

## 2015-08-27 NOTE — ED Notes (Signed)
arctic sun pads applied to pt. Prior to transferring to Cath Lab

## 2015-08-27 NOTE — Code Documentation (Signed)
Ice pack being applied . Dr. Martinique at the bedside speaking with Dr.Schlossman

## 2015-08-27 NOTE — Progress Notes (Signed)
eLink Physician-Brief Progress Note Patient Name: Jerry Chase DOB: 1935/06/13 MRN: [id number]   Date of Service  08/20/2015  HPI/Events of Note  Blood glucose = 245.  eICU Interventions  Will order Q 4 hour sensitive Novolog SSI coverage.     Intervention Category Intermediate Interventions: Hyperglycemia - evaluation and treatment  Sommer,Steven Cornelia Copa 08/25/2015, 5:48 PM

## 2015-08-27 NOTE — Code Documentation (Signed)
Pulse check done , ROS

## 2015-08-27 NOTE — Procedures (Signed)
Central Venous Catheter Insertion Procedure Note AFSHIN SCHEUMANN [id number] December 28, 1934  Procedure: Insertion of Central Venous Catheter Indications: Assessment of intravascular volume, Drug and/or fluid administration, Frequent blood sampling and hypothermia  Procedure Details Consent: Risks of procedure as well as the alternatives and risks of each were explained to the (patient/caregiver).  Consent for procedure obtained. Time Out: Verified patient identification, verified procedure, site/side was marked, verified correct patient position, special equipment/implants available, medications/allergies/relevent history reviewed, required imaging and test results available.  Performed  Maximum sterile technique was used including antiseptics, cap, gloves, gown, hand hygiene, mask and sheet. Skin prep: Chlorhexidine; local anesthetic administered A antimicrobial bonded/coated triple lumen catheter was placed in the left internal jugular vein using the Seldinger technique.  Evaluation Blood flow good Complications: No apparent complications Patient did tolerate procedure well. Chest X-ray ordered to verify placement.  CXR: pending.  Raylene Miyamoto 09/08/2015, 1:55 PM  [place]  Lavon Paganini. Titus Mould, MD, [place] Pgr: [facility]

## 2015-08-28 ENCOUNTER — Encounter (HOSPITAL_COMMUNITY): Payer: Self-pay | Admitting: Family Medicine

## 2015-08-28 DIAGNOSIS — J9601 Acute respiratory failure with hypoxia: Secondary | ICD-10-CM

## 2015-08-28 LAB — LACTIC ACID, PLASMA
LACTIC ACID, VENOUS: 2.2 mmol/L — AB (ref 0.5–2.0)
Lactic Acid, Venous: 1.6 mmol/L (ref 0.5–2.0)

## 2015-08-28 LAB — CBC
HEMATOCRIT: 34.6 % — AB (ref 39.0–52.0)
HEMOGLOBIN: 11.5 g/dL — AB (ref 13.0–17.0)
MCH: 28.5 pg (ref 26.0–34.0)
MCHC: 33.2 g/dL (ref 30.0–36.0)
MCV: 85.6 fL (ref 78.0–100.0)
Platelets: 336 10*3/uL (ref 150–400)
RBC: 4.04 MIL/uL — AB (ref 4.22–5.81)
RDW: 13.2 % (ref 11.5–15.5)
WBC: 15.7 10*3/uL — ABNORMAL HIGH (ref 4.0–10.5)

## 2015-08-28 LAB — POCT I-STAT, CHEM 8
BUN: 25 mg/dL — AB (ref 6–20)
BUN: 29 mg/dL — AB (ref 6–20)
BUN: 30 mg/dL — ABNORMAL HIGH (ref 6–20)
BUN: 31 mg/dL — ABNORMAL HIGH (ref 6–20)
BUN: 32 mg/dL — AB (ref 6–20)
BUN: 32 mg/dL — ABNORMAL HIGH (ref 6–20)
CALCIUM ION: 1.25 mmol/L (ref 1.13–1.30)
CALCIUM ION: 1.14 mmol/L (ref 1.13–1.30)
CHLORIDE: 104 mmol/L (ref 101–111)
CHLORIDE: 109 mmol/L (ref 101–111)
CHLORIDE: 110 mmol/L (ref 101–111)
CHLORIDE: 112 mmol/L — AB (ref 101–111)
CREATININE: 1.3 mg/dL — AB (ref 0.61–1.24)
CREATININE: 1.4 mg/dL — AB (ref 0.61–1.24)
CREATININE: 1.4 mg/dL — AB (ref 0.61–1.24)
CREATININE: 1.4 mg/dL — AB (ref 0.61–1.24)
CREATININE: 1.5 mg/dL — AB (ref 0.61–1.24)
Calcium, Ion: 1.17 mmol/L (ref 1.13–1.30)
Calcium, Ion: 1.18 mmol/L (ref 1.13–1.30)
Calcium, Ion: 1.2 mmol/L (ref 1.13–1.30)
Calcium, Ion: 1.24 mmol/L (ref 1.13–1.30)
Chloride: 111 mmol/L (ref 101–111)
Chloride: 112 mmol/L — ABNORMAL HIGH (ref 101–111)
Creatinine, Ser: 1.5 mg/dL — ABNORMAL HIGH (ref 0.61–1.24)
GLUCOSE: 223 mg/dL — AB (ref 65–99)
GLUCOSE: 158 mg/dL — AB (ref 65–99)
GLUCOSE: 139 mg/dL — AB (ref 65–99)
Glucose, Bld: 108 mg/dL — ABNORMAL HIGH (ref 65–99)
Glucose, Bld: 112 mg/dL — ABNORMAL HIGH (ref 65–99)
Glucose, Bld: 114 mg/dL — ABNORMAL HIGH (ref 65–99)
HCT: 38 % — ABNORMAL LOW (ref 39.0–52.0)
HCT: 37 % — ABNORMAL LOW (ref 39.0–52.0)
HCT: 36 % — ABNORMAL LOW (ref 39.0–52.0)
HCT: 38 % — ABNORMAL LOW (ref 39.0–52.0)
HEMATOCRIT: 34 % — AB (ref 39.0–52.0)
HEMATOCRIT: 36 % — AB (ref 39.0–52.0)
HEMOGLOBIN: 11.6 g/dL — AB (ref 13.0–17.0)
HEMOGLOBIN: 12.2 g/dL — AB (ref 13.0–17.0)
HEMOGLOBIN: 12.9 g/dL — AB (ref 13.0–17.0)
Hemoglobin: 12.9 g/dL — ABNORMAL LOW (ref 13.0–17.0)
Hemoglobin: 12.6 g/dL — ABNORMAL LOW (ref 13.0–17.0)
Hemoglobin: 12.2 g/dL — ABNORMAL LOW (ref 13.0–17.0)
POTASSIUM: 3.5 mmol/L (ref 3.5–5.1)
POTASSIUM: 3.7 mmol/L (ref 3.5–5.1)
POTASSIUM: 3.8 mmol/L (ref 3.5–5.1)
POTASSIUM: 3.8 mmol/L (ref 3.5–5.1)
Potassium: 4.2 mmol/L (ref 3.5–5.1)
Potassium: 3.6 mmol/L (ref 3.5–5.1)
SODIUM: 138 mmol/L (ref 135–145)
SODIUM: 139 mmol/L (ref 135–145)
Sodium: 139 mmol/L (ref 135–145)
Sodium: 140 mmol/L (ref 135–145)
Sodium: 140 mmol/L (ref 135–145)
Sodium: 140 mmol/L (ref 135–145)
TCO2: 15 mmol/L (ref 0–100)
TCO2: 16 mmol/L (ref 0–100)
TCO2: 18 mmol/L (ref 0–100)
TCO2: 18 mmol/L (ref 0–100)
TCO2: 18 mmol/L (ref 0–100)
TCO2: 21 mmol/L (ref 0–100)

## 2015-08-28 LAB — GLUCOSE, CAPILLARY
GLUCOSE-CAPILLARY: 164 mg/dL — AB (ref 65–99)
GLUCOSE-CAPILLARY: 168 mg/dL — AB (ref 65–99)
GLUCOSE-CAPILLARY: 139 mg/dL — AB (ref 65–99)
GLUCOSE-CAPILLARY: 113 mg/dL — AB (ref 65–99)
GLUCOSE-CAPILLARY: 93 mg/dL (ref 65–99)
Glucose-Capillary: 148 mg/dL — ABNORMAL HIGH (ref 65–99)
Glucose-Capillary: 108 mg/dL — ABNORMAL HIGH (ref 65–99)

## 2015-08-28 LAB — BASIC METABOLIC PANEL
ANION GAP: 15 (ref 5–15)
ANION GAP: 13 (ref 5–15)
BUN: 29 mg/dL — ABNORMAL HIGH (ref 6–20)
BUN: 30 mg/dL — ABNORMAL HIGH (ref 6–20)
CALCIUM: 9.5 mg/dL (ref 8.9–10.3)
CALCIUM: 9 mg/dL (ref 8.9–10.3)
CO2: 14 mmol/L — ABNORMAL LOW (ref 22–32)
CO2: 16 mmol/L — ABNORMAL LOW (ref 22–32)
Chloride: 109 mmol/L (ref 101–111)
Chloride: 110 mmol/L (ref 101–111)
Creatinine, Ser: 1.7 mg/dL — ABNORMAL HIGH (ref 0.61–1.24)
Creatinine, Ser: 1.63 mg/dL — ABNORMAL HIGH (ref 0.61–1.24)
GFR, EST AFRICAN AMERICAN: 42 mL/min — AB (ref >60)
GFR, EST AFRICAN AMERICAN: 45 mL/min — AB (ref >60)
GFR, EST NON AFRICAN AMERICAN: 37 mL/min — AB (ref >60)
GFR, EST NON AFRICAN AMERICAN: 38 mL/min — AB (ref >60)
GLUCOSE: 169 mg/dL — AB (ref 65–99)
Glucose, Bld: 188 mg/dL — ABNORMAL HIGH (ref 65–99)
Potassium: 4.2 mmol/L (ref 3.5–5.1)
Potassium: 4.5 mmol/L (ref 3.5–5.1)
SODIUM: 138 mmol/L (ref 135–145)
Sodium: 139 mmol/L (ref 135–145)

## 2015-08-28 LAB — PHOSPHORUS: Phosphorus: 2.6 mg/dL (ref 2.5–4.6)

## 2015-08-28 LAB — TROPONIN I
TROPONIN I: 0.44 ng/mL — AB (ref <0.031)
TROPONIN I: 0.35 ng/mL — AB (ref <0.031)

## 2015-08-28 LAB — APTT
APTT: 79 s — AB (ref 24–37)
aPTT: 59 seconds — ABNORMAL HIGH (ref 24–37)
aPTT: 104 seconds — ABNORMAL HIGH (ref 24–37)

## 2015-08-28 LAB — MAGNESIUM
Magnesium: 1.7 mg/dL (ref 1.7–2.4)
Magnesium: 2 mg/dL (ref 1.7–2.4)

## 2015-08-28 LAB — HEPARIN LEVEL (UNFRACTIONATED): HEPARIN UNFRACTIONATED: 0.98 [IU]/mL — AB (ref 0.30–0.70)

## 2015-08-28 MED ORDER — POTASSIUM CHLORIDE 10 MEQ/50ML IV SOLN
10.0000 meq | INTRAVENOUS | Status: AC
  Administered 2015-08-28 (×5): 10 meq via INTRAVENOUS
  Filled 2015-08-28 (×5): qty 50

## 2015-08-28 MED ORDER — MAGNESIUM SULFATE IN D5W 10-5 MG/ML-% IV SOLN
1.0000 g | Freq: Once | INTRAVENOUS | Status: AC
  Administered 2015-08-28: 1 g via INTRAVENOUS
  Filled 2015-08-28: qty 100

## 2015-08-28 MED ORDER — INFLUENZA VAC SPLIT QUAD 0.5 ML IM SUSY
0.5000 mL | PREFILLED_SYRINGE | INTRAMUSCULAR | Status: AC
  Administered 2015-08-31: 0.5 mL via INTRAMUSCULAR
  Filled 2015-08-28: qty 0.5

## 2015-08-28 MED ORDER — POTASSIUM CHLORIDE 10 MEQ/50ML IV SOLN
10.0000 meq | INTRAVENOUS | Status: AC
  Administered 2015-08-28 (×3): 10 meq via INTRAVENOUS
  Filled 2015-08-28 (×3): qty 50

## 2015-08-28 MED ORDER — PNEUMOCOCCAL VAC POLYVALENT 25 MCG/0.5ML IJ INJ
0.5000 mL | INJECTION | INTRAMUSCULAR | Status: AC
  Administered 2015-08-31: 0.5 mL via INTRAMUSCULAR
  Filled 2015-08-28: qty 0.5

## 2015-08-28 MED ORDER — SODIUM CHLORIDE 0.9 % IV SOLN
INTRAVENOUS | Status: DC
  Administered 2015-08-28 – 2015-08-29 (×3): via INTRAVENOUS

## 2015-08-28 NOTE — Progress Notes (Signed)
Subjective:  Intubated and sedated and on hypothermia protocol. Has not needed any pressor support, good UOP  Objective:  Vital Signs in the last 24 hours: Temp:  [88.7 F (31.5 C)-97.2 F (36.2 C)] 91.8 F (33.2 C) (02/12 1400) Pulse Rate:  [32-122] 55 (02/12 1300) Resp:  [0-54] 30 (02/12 1300) BP: (90-138)/(60-97) 110/79 mmHg (02/12 1300) SpO2:  [98 %-100 %] 100 % (02/12 1300) Arterial Line BP: (97-136)/(62-86) 125/71 mmHg (02/12 1300) FiO2 (%):  [50 %-100 %] 50 % (02/12 1130)  Intake/Output from previous day: 02/11 0701 - 02/12 0700 In: 1164.3 [I.V.:814.3; IV Piggyback:350] Out: 295 [Urine:295]  Physical Exam: General appearance: Patient is intubated, unresponsive. Presently on cooling protocol.  Lungs: clear to auscultation bilaterally Chest wall: Chest appears normal, barrel-shaped. No trauma. Heart: Distant heart sounds, murmurs could not be made out. No gallop appreciated. Abdomen: soft, non-tender; bowel sounds normal; no masses, no organomegaly and Obese Extremities: extremities normal, atraumatic, no cyanosis or edema Pulses: Faint pulses, JVD could not be made out due to central line placement and patient being intubated. Right radial access site healthy and now has an arterial line.  Lab Results: BMP  Recent Labs  09/03/2015 2145 08/28/15 0200 08/28/15 0600 08/28/15 0800 08/28/15 1205  NA 137 138 139 139 140  K 3.1* 4.2 4.5 3.8 3.5  CL 103 109 110 109 110  CO2 18* 14* 16*  --   --   GLUCOSE 223* 188* 169* 158* 139*  BUN 27* 29* 30* 30* 31*  CREATININE 1.79* 1.70* 1.63* 1.40* 1.40*  CALCIUM 9.1 9.5 9.0  --   --   GFRNONAA 34* 37* 38*  --   --   GFRAA 40* 42* 45*  --   --     CBC  Recent Labs Lab 09/08/2015 1255  08/28/15 0800 08/28/15 1205  WBC 12.2*  --  15.7*  --   RBC 4.00*  --  4.04*  --   HGB 11.8*  < > 11.5*  12.6* 12.2*  HCT 37.5*  < > 34.6*  37.0* 36.0*  PLT 335  --  336  --   MCV 93.8  --  85.6  --   MCH 29.5  --  28.5  --   MCHC  31.5  --  33.2  --   RDW 13.2  --  13.2  --   LYMPHSABS 3.7  --   --   --   MONOABS 0.5  --   --   --   EOSABS 0.2  --   --   --   BASOSABS 0.1  --   --   --   < > = values in this interval not displayed.  HEMOGLOBIN A1C No results found for: HGBA1C, MPG  Cardiac Panel (last 3 results)  Recent Labs  09/01/2015 2000 08/28/15 0200 08/28/15 0800  TROPONINI 0.34* 0.44* 0.35*   Cardiac Studies:  EKG:  Sinus pericardia with first-degree AV block at a rate of 50 beats a minute, low-voltage comp access. Diffuse nonspecific ST depression with T-wave inversion noted globally, prolonged QT interval.  Assessment/Plan:  1. Ischemic and nonischemic cardiomyopathy with acute systolic and diastolic heart failure 2. Ventricular fibrillation arrest, probably precipitated by cardiomyopathy. 3.  LV systolic dysfunction due to CPR and need for defibrillation could also be the etiology for cardiomyopathy. 4. Coronary artery disease with a moderate sized AV groove branch revealing ulcerated 80% stenosis, mid 3 flow hence left alone. This could have precipitated arrhythmia. 5. Chronic atrial  fibrillation, now in sinus rhythm with first-degree AV block CHA2DS2-VASCScore: Risk Score  6 (CHF and new DM),  Yearly risk of stroke  9.8.  6. Chronic long-term anticoagulation on Xarelto, presently on IV heparin as patient may need EP evaluation and possible need for ICD once he is extubated and stable. 7. Diabetes mellitus new, cannot exclude stress-induced diabetes. 8. Acute on chronic renal failure, resolving  Recommendation: Patient presently not on any antiarrhythmic therapy. Continue present medications, he has maintained adequate perfusion and vital signs remained stable without any recurrence of arrhythmias. I will repeat echocardiogram once he is off the cooling to obtain adequate imaging. Otherwise am fine in holding beta blocker for now due to low heart rate and also borderline blood pressure.   Adrian Prows, M.D. 08/28/2015, 2:15 PM [facility] Pager: [phone] Office: [phone] If no answer: [phone]

## 2015-08-28 NOTE — Progress Notes (Signed)
[facility] Progress Note Patient Name: Jerry Chase DOB: 10-29-1934 MRN: [id number]   Date of Service  08/28/2015  HPI/Events of Note  Contacted by bedside nurse regarding hypokalemia 3.1. Patient is having PVCs. Currently undergoing therapeutic cooling. Currently on paralytic.  eICU Interventions  1. Potassium chloride 10 mEq IV 5 runs 2. Add on magnesium to previous lab draw     Intervention Category Intermediate Interventions: Electrolyte abnormality - evaluation and management  Tera Partridge 08/28/2015, 12:21 AM

## 2015-08-28 NOTE — Progress Notes (Signed)
ANTICOAGULATION CONSULT NOTE - Follow Up Consult  Pharmacy Consult for heparin Indication: atrial fibrillation and s/p Vfib arrest w/ presumed CAD   Labs:  Recent Labs  08/25/2015 1255  08/24/2015 1348  09/02/2015 1550 08/22/2015 1800 08/17/2015 2000 09/09/2015 2145 09/10/2015 2300 08/28/15 0200  HGB 11.8*  --  13.3  --   --   --   --   --   --   --   HCT 37.5*  --  39.0  --   --   --   --   --   --   --   PLT 335  --   --   --   --   --   --   --   --   --   APTT  --   --   --   < > 91* 29  --   --  41* 59*  LABPROT  --   --   --   --  20.7*  --   --   --  19.1*  --   INR  --   --   --   --  1.78*  --   --   --  1.60*  --   HEPARINUNFRC  --   --   --   --   --  >2.20*  --   --   --   --   CREATININE  --   < > 1.20  --   --  1.56* 1.74* 1.79*  --  1.70*  TROPONINI  --   --   --   --   --  0.31* 0.34*  --   --  0.44*  < > = values in this interval not displayed.    Assessment: 81yo male subtherapeutic on heparin with initial dosing while being cooled s/p Vfib arrest.  Goal of Therapy:  aPTT 66-102 seconds   Plan:  Will increase heparin gtt conservatively to 850 units/hr and check PTT in 6hr.  Wynona Neat, PharmD, BCPS  08/28/2015,3:22 AM
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
## 2015-08-28 NOTE — ED Provider Notes (Signed)
CSN: [id number]     Arrival date & time 09/01/2015  1240 History   First MD Initiated Contact with Patient 08/30/2015 1319     No chief complaint on file.    (Consider location/radiation/quality/duration/timing/severity/associated sxs/prior Treatment) HPI Comments: 81yo male with history of COPD, hypertension, hyperlipidemia, atrial fibrillation on xarelto, presents with concern for post-cardiac arrest. Patient witnessed arrest by wife while at [place] in the car. Bystanders began CPR just prior to EMS arrival with approximately 5 minutes of downtime prior to their arrival. Patient found to be in ventricular fibrillation, defibrillated x4, given 4 of epi, 450 total of amiodarone with ROSC after 44min of CPR.  WIfe reports he did not have any symptoms, had been in normal state of health this morning, until this moment at stoplight when he became unresponsive, made gurgling noise, looked blue.   No past medical history on file. No past surgical history on file. No family history on file. Social History  Substance Use Topics  . Smoking status: Not on file  . Smokeless tobacco: Not on file  . Alcohol Use: Not on file    Review of Systems  Unable to perform ROS: Mental status change      Allergies  Review of patient's allergies indicates not on file.  Home Medications   Prior to Admission medications   Medication Sig Start Date End Date Taking? Authorizing Provider  diltiazem (CARDIZEM CD) 180 MG 24 hr capsule Take 180 mg by mouth daily.   Yes Historical Provider, MD  Fluticasone-Salmeterol (ADVAIR) 250-50 MCG/DOSE AEPB Inhale 1 puff into the lungs 2 (two) times daily.   Yes Historical Provider, MD  guaiFENesin (MUCINEX) 600 MG 12 hr tablet Take 600 mg by mouth 2 (two) times daily as needed for cough or to loosen phlegm.   Yes Historical Provider, MD  Ipratropium-Albuterol (COMBIVENT RESPIMAT) 20-100 MCG/ACT AERS respimat Inhale 1 puff into the lungs every 6 (six) hours as needed for  wheezing or shortness of breath.    Yes Historical Provider, MD  metoprolol (LOPRESSOR) 50 MG tablet Take 25 mg by mouth 2 (two) times daily.   Yes Historical Provider, MD  Phenyleph-CPM-DM-APAP (ALKA-SELTZER PLUS COLD & COUGH) 11-14-08-325 MG CAPS Take 2 capsules by mouth daily as needed (for cold).   Yes Historical Provider, MD  pravastatin (PRAVACHOL) 80 MG tablet Take 80 mg by mouth daily.   Yes Historical Provider, MD  rivaroxaban (XARELTO) 20 MG TABS tablet Take 20 mg by mouth daily with supper.   Yes Historical Provider, MD  valsartan-hydrochlorothiazide (DIOVAN-HCT) 160-25 MG tablet Take 1 tablet by mouth daily.   Yes Historical Provider, MD   BP 94/74 mmHg  Pulse 68  Temp(Src) 89.8 F (32.1 C) (Core (Comment))  Resp 30  Ht 6' (1.829 m)  Wt 280 lb (127.007 kg)  BMI 37.97 kg/m2  SpO2 100% Physical Exam  Constitutional: He appears well-developed and well-nourished. He appears toxic. He has a sickly appearance. No distress.  King airway in place  HENT:  Head: Normocephalic and atraumatic.  Eyes: Conjunctivae are normal. Pupils are equal, round, and reactive to light.  Neck: Full passive range of motion without pain.  Cardiovascular: Normal rate, regular rhythm, normal heart sounds and intact distal pulses.  Exam reveals no gallop and no friction rub.   No murmur heard. Pulmonary/Chest: Effort normal and breath sounds normal. No respiratory distress. He has no wheezes. He has no rales.  Abdominal: Soft. He exhibits no distension. There is no guarding.  Musculoskeletal: He  exhibits no edema.  Neurological: He is unresponsive. GCS eye subscore is 1. GCS verbal subscore is 1. GCS motor subscore is 1.  Skin: Skin is dry. He is not diaphoretic.  cool  Nursing note and vitals reviewed.   ED Course  .Intubation Date/Time: 08/28/2015 4:27 AM Performed by: Gareth Morgan Authorized by: Gareth Morgan Consent: The procedure was performed in an emergent situation. Required items:  required blood products, implants, devices, and special equipment available Patient identity confirmed: arm band Time out: Immediately prior to procedure a "time out" was called to verify the correct patient, procedure, equipment, support staff and site/side marked as required. Indications: airway protection and  respiratory failure Intubation method: video-assisted (glidescope) Patient status: paralyzed (RSI) Preoxygenation: ILMA/LMA (king) Paralytic: rocuronium Tube size: 8.0 mm Tube type: cuffed Number of attempts: 1 Cords visualized: yes Post-procedure assessment: chest rise,  ETCO2 monitor and CO2 detector Breath sounds: equal Cuff inflated: yes Tube secured with: ETT holder Chest x-ray interpreted by me. Chest x-ray findings: endotracheal tube in appropriate position Patient tolerance: Patient tolerated the procedure well with no immediate complications  .Cardioversion Date/Time: 08/28/2015 4:35 AM Performed by: Gareth Morgan Authorized by: Gareth Morgan Consent: The procedure was performed in an emergent situation. Time out: Immediately prior to procedure a "time out" was called to verify the correct patient, procedure, equipment, support staff and site/side marked as required. Patient sedated: no Cardioversion basis: emergent Pre-procedure rhythm: ventricular tachycardia Patient position: patient was placed in a supine position Chest area: chest area exposed Electrodes: pads Electrodes placed: anterior-lateral (changed to a-p prior to third attempt) Number of attempts: 3 Attempt 1 mode: synchronous Attempt 1 shock (in Joules): 150 Attempt 1 outcome: no change in rhythm Attempt 2 mode: synchronous Attempt 2 shock (in Joules): 200 Attempt 2 outcome: no change in rhythm Attempt 3 mode: synchronous Attempt 3 shock (in Joules): 200 Attempt 3 outcome: conversion to other rhythm Post-procedure rhythm: atrial fibrillation Complications: no complications Patient  tolerance: Patient tolerated the procedure well with no immediate complications   (including critical care time) Labs Review Labs Reviewed  CBC WITH DIFFERENTIAL/PLATELET - Abnormal; Notable for the following:    WBC 12.2 (*)    RBC 4.00 (*)    Hemoglobin 11.8 (*)    HCT 37.5 (*)    All other components within normal limits  PHOSPHORUS - Abnormal; Notable for the following:    Phosphorus 6.7 (*)    All other components within normal limits  PROTIME-INR - Abnormal; Notable for the following:    Prothrombin Time 20.7 (*)    INR 1.78 (*)    All other components within normal limits  APTT - Abnormal; Notable for the following:    aPTT 91 (*)    All other components within normal limits  HEPARIN LEVEL (UNFRACTIONATED) - Abnormal; Notable for the following:    Heparin Unfractionated >2.20 (*)    All other components within normal limits  GLUCOSE, CAPILLARY - Abnormal; Notable for the following:    Glucose-Capillary 180 (*)    All other components within normal limits  GLUCOSE, CAPILLARY - Abnormal; Notable for the following:    Glucose-Capillary 245 (*)    All other components within normal limits  BASIC METABOLIC PANEL - Abnormal; Notable for the following:    CO2 17 (*)    Glucose, Bld 277 (*)    BUN 25 (*)    Creatinine, Ser 1.56 (*)    GFR calc non Af Amer 41 (*)    GFR calc  Af Amer 47 (*)    Anion gap 16 (*)    All other components within normal limits  BASIC METABOLIC PANEL - Abnormal; Notable for the following:    CO2 17 (*)    Glucose, Bld 272 (*)    BUN 26 (*)    Creatinine, Ser 1.74 (*)    GFR calc non Af Amer 36 (*)    GFR calc Af Amer 41 (*)    Anion gap 17 (*)    All other components within normal limits  LACTIC ACID, PLASMA - Abnormal; Notable for the following:    Lactic Acid, Venous 3.2 (*)    All other components within normal limits  LACTIC ACID, PLASMA - Abnormal; Notable for the following:    Lactic Acid, Venous 3.1 (*)    All other components within  normal limits  TROPONIN I - Abnormal; Notable for the following:    Troponin I 0.31 (*)    All other components within normal limits  TROPONIN I - Abnormal; Notable for the following:    Troponin I 0.34 (*)    All other components within normal limits  PROTIME-INR - Abnormal; Notable for the following:    Prothrombin Time 19.1 (*)    INR 1.60 (*)    All other components within normal limits  APTT - Abnormal; Notable for the following:    aPTT 41 (*)    All other components within normal limits  APTT - Abnormal; Notable for the following:    aPTT 59 (*)    All other components within normal limits  GLUCOSE, CAPILLARY - Abnormal; Notable for the following:    Glucose-Capillary 219 (*)    All other components within normal limits  GLUCOSE, CAPILLARY - Abnormal; Notable for the following:    Glucose-Capillary 208 (*)    All other components within normal limits  TROPONIN I - Abnormal; Notable for the following:    Troponin I 0.44 (*)    All other components within normal limits  GLUCOSE, CAPILLARY - Abnormal; Notable for the following:    Glucose-Capillary 226 (*)    All other components within normal limits  GLUCOSE, CAPILLARY - Abnormal; Notable for the following:    Glucose-Capillary 212 (*)    All other components within normal limits  BASIC METABOLIC PANEL - Abnormal; Notable for the following:    CO2 14 (*)    Glucose, Bld 188 (*)    BUN 29 (*)    Creatinine, Ser 1.70 (*)    GFR calc non Af Amer 37 (*)    GFR calc Af Amer 42 (*)    All other components within normal limits  BASIC METABOLIC PANEL - Abnormal; Notable for the following:    Potassium 3.1 (*)    CO2 18 (*)    Glucose, Bld 223 (*)    BUN 27 (*)    Creatinine, Ser 1.79 (*)    GFR calc non Af Amer 34 (*)    GFR calc Af Amer 40 (*)    Anion gap 16 (*)    All other components within normal limits  GLUCOSE, CAPILLARY - Abnormal; Notable for the following:    Glucose-Capillary 179 (*)    All other components  within normal limits  GLUCOSE, CAPILLARY - Abnormal; Notable for the following:    Glucose-Capillary 164 (*)    All other components within normal limits  GLUCOSE, CAPILLARY - Abnormal; Notable for the following:    Glucose-Capillary 168 (*)  All other components within normal limits  I-STAT CHEM 8, ED - Abnormal; Notable for the following:    BUN 27 (*)    Glucose, Bld 271 (*)    Calcium, Ion 1.45 (*)    All other components within normal limits  I-STAT VENOUS BLOOD GAS, ED - Abnormal; Notable for the following:    pH, Ven 7.119 (*)    pCO2, Ven 56.2 (*)    pO2, Ven 122.0 (*)    Bicarbonate 18.2 (*)    Acid-base deficit 11.0 (*)    All other components within normal limits  I-STAT CG4 LACTIC ACID, ED - Abnormal; Notable for the following:    Lactic Acid, Venous 8.29 (*)    All other components within normal limits  POCT I-STAT 3, ART BLOOD GAS (G3+) - Abnormal; Notable for the following:    pH, Arterial 7.451 (*)    pCO2 arterial 24.7 (*)    pO2, Arterial 344.0 (*)    Bicarbonate 18.0 (*)    Acid-base deficit 6.0 (*)    All other components within normal limits  MRSA PCR SCREENING  MAGNESIUM  APTT  MAGNESIUM  CBC  TROPONIN I  BASIC METABOLIC PANEL  BASIC METABOLIC PANEL  BASIC METABOLIC PANEL  BASIC METABOLIC PANEL  BASIC METABOLIC PANEL  MAGNESIUM  PHOSPHORUS  APTT  I-STAT TROPOININ, ED    Imaging Review Dg Chest Portable 1 View  08/21/2015  CLINICAL DATA:  Central catheter placement.  Hypoxia. EXAM: PORTABLE CHEST 1 VIEW COMPARISON:  Study obtained earlier in the day FINDINGS: Left central catheter tip is in the left innominate vein. Endotracheal tube tip is 3.8 cm above the carina. Nasogastric tube tip and side port are in the stomach. No pneumothorax apparent. The ill-defined opacity in the left upper lobe near the apex persists but is slightly less apparent currently compared to earlier in the day. No new opacity. Heart is mildly enlarged with pulmonary  vascularity within normal limits. There is atherosclerotic calcification in the aorta. No adenopathy. IMPRESSION: New central catheter tip is in the left innominate vein. Tube positions as described without pneumothorax. Ill-defined opacity in the left upper lobe near the apex, suspicious for pneumonia. No new opacity. Stable cardiac enlargement. Electronically Signed   By: Lowella Grip III M.D.   On: 08/17/2015 14:04   Dg Chest Portable 1 View  08/24/2015  CLINICAL DATA:  CPR.  Endotracheal tube placement. EXAM: PORTABLE CHEST 1 VIEW COMPARISON:  None. FINDINGS: Endotracheal tube tip is between the clavicular heads and carina. Cardiomegaly with negative aortic and hilar contours. There is an indistinct rounded opacity over the left apex. Nondisplaced right third and fifth rib fractures, likely acute. No effusion or pneumothorax. IMPRESSION: 1. Endotracheal tube in place. 2. Left apical opacity which could reflect infection, aspiration, or mass. 3. Cardiomegaly without failure. 4. Nondisplaced right rib fractures, favored acute. No pneumothorax. Electronically Signed   By: Monte Fantasia M.D.   On: 08/22/2015 13:24   I have personally reviewed and evaluated these images and lab results as part of my medical decision-making.   EKG Interpretation   Date/Time:  Saturday August 27 2015 12:53:46 EST Ventricular Rate:  120 PR Interval:  131 QRS Duration: 143 QT Interval:  397 QTC Calculation: 561 R Axis:   150 Text Interpretation:  Sinus tachycardia Right bundle branch block Baseline  wander in lead(s) I III aVL No significant change since last tracing  Confirmed by [facility] MD, Lakia Gritton ([id number]) on 08/28/2015 4:22:54 AM  CRITICAL CARE: post arrest, PEA arrest, VTACH with a pulse, intubation, hypothermia protocol Performed by: Alvino Chapel   Total critical care time: 45 minutes  Critical care time was exclusive of separately billable procedures and treating other  patients.  Critical care was necessary to treat or prevent imminent or life-threatening deterioration.  Critical care was time spent personally by me on the following activities: development of treatment plan with patient and/or surrogate as well as nursing, discussions with consultants, evaluation of patient's response to treatment, examination of patient, obtaining history from patient or surrogate, ordering and performing treatments and interventions, ordering and review of laboratory studies, ordering and review of radiographic studies, pulse oximetry and re-evaluation of patient's condition.   MDM   Final diagnoses:  None   81yo male with history of COPD, hypertension, hyperlipidemia, atrial fibrillation on xarelto, presents with concern for post-cardiac arrest. Patient witnessed arrest by wife while at [place] in the car. Bystanders began CPR just prior to EMS arrival with approximately 5 minutes of downtime prior to their arrival. Patient found to be in ventricular fibrillation, defibrillated x4, given 4 of epi, 450 total of amiodarone with ROSC after 44min of CPR.  On arrival, patient with king airway, initially with pulses, and briefly went into PEA with 2 minutes of CPR, 1 epi and ROSC.  Patient with pulse after this, however with ventricular tachycardia and was cardioverted x3 for unstable vtach with a pulse and given 150 amio. He was intubated for respiratory failure and airway protection with GCS of 3. Patient with return to atrial fibrillation, widened RBBB, improvement in blood pressures.  Cardiology consulted, initially Dr. Martinique came to bedside prior to discovery that pt sees Dr. Einar Gip, and Dr. Einar Gip came to bedside to evaluate patient.  Given vfib arrest, concern for primary cardiac etiology, patient taken to the cath lab.  Cooling protocol initiated, critical care consulted, family updated on plan of care.     Gareth Morgan, MD 08/28/15 [phone]

## 2015-08-28 NOTE — Progress Notes (Signed)
[facility] Progress Note Patient Name: Jerry Chase DOB: [birth year] MRN: [id number]   Date of Service  08/28/2015  HPI/Events of Note  Borderline serum magnesium 1.7  eICU Interventions  Magnesium sulfate 1gm IV     Intervention Category Intermediate Interventions: Electrolyte abnormality - evaluation and management  Tera Partridge 08/28/2015, 2:55 AM

## 2015-08-28 NOTE — Progress Notes (Signed)
Utilization review completed.  

## 2015-08-28 NOTE — Progress Notes (Signed)
CRITICAL VALUE ALERT  Critical value received:  Lactic Acid 2.2  Date of notification:  08/28/15  Time of notification:  *0830  Critical value read back:Yes.    Nurse who received alert:  Deberah Castle   CCM MD notified.

## 2015-08-28 NOTE — Progress Notes (Signed)
ANTICOAGULATION CONSULT NOTE - Follow Up Consult  Pharmacy Consult for heparin Indication: atrial fibrillation and s/p Vfib arrest w/ presumed CAD   Labs:  Recent Labs  08/24/2015 1255  09/06/2015 1348 09/01/2015 1519  09/12/2015 1550  08/20/2015 1800 09/06/2015 2000  08/20/2015 2300 08/28/15 0200 08/28/15 0600 08/28/15 0800 08/28/15 1000  HGB 11.8*  --  13.3 12.9*  --   --   --   --   --   --   --   --   --  11.5*  12.6*  --   HCT 37.5*  --  39.0 38.0*  --   --   --   --   --   --   --   --   --  34.6*  37.0*  --   PLT 335  --   --   --   --   --   --   --   --   --   --   --   --  336  --   APTT  --   --   --   --   < > 91*  --  29  --   --  41* 59*  --   --  104*  LABPROT  --   --   --   --   --  20.7*  --   --   --   --  19.1*  --   --   --   --   INR  --   --   --   --   --  1.78*  --   --   --   --  1.60*  --   --   --   --   HEPARINUNFRC  --   --   --   --   --   --   --  >2.20*  --   --   --   --   --   --   --   CREATININE  --   < > 1.20 1.40*  --   --   --  1.56* 1.74*  < >  --  1.70* 1.63* 1.40*  --   TROPONINI  --   --   --   --   --   --   < > 0.31* 0.34*  --   --  0.44*  --  0.35*  --   < > = values in this interval not displayed.    Assessment: 81yo male subtherapeutic on heparin with initial dosing while being cooled s/p Vfib arrest. On Xarelto PTA for Afib. Pharmacy to start IV heparin.  6hr aPTT 104s (goal 66-102s) on 850 units/hr. Will decrease to 800 units/hr.  Hgb 12.6, PLT 336, no bleeding noted.   Goal of Therapy:  aPTT 66-102 seconds   Plan:  Will decrease heparin drip to 800 units/hr  6 hr aPTT Monitor for s/s of bleeding  Noha Milberger C. Lennox Grumbles, PharmD Pharmacy Resident  Pager: [phone] 08/28/2015 11:14 AM

## 2015-08-28 NOTE — Progress Notes (Signed)
ANTICOAGULATION CONSULT NOTE - Follow Up Consult  Pharmacy Consult for heparin Indication: atrial fibrillation and s/p Vfib arrest w/ presumed CAD   Labs:  Recent Labs  09/10/2015 1255  08/26/2015 1550  08/17/2015 1800 09/13/2015 2000  08/20/2015 2300 08/28/15 0200  08/28/15 0800 08/28/15 1000 08/28/15 1205 08/28/15 1555 08/28/15 1800  HGB 11.8*  < >  --   --   --   --   --   --   --   --  11.5*  12.6*  --  12.2* 11.6*  --   HCT 37.5*  < >  --   --   --   --   --   --   --   --  34.6*  37.0*  --  36.0* 34.0*  --   PLT 335  --   --   --   --   --   --   --   --   --  336  --   --   --   --   APTT  --   < > 91*  --  29  --   --  41* 59*  --   --  104*  --   --  79*  LABPROT  --   --  20.7*  --   --   --   --  19.1*  --   --   --   --   --   --   --   INR  --   --  1.78*  --   --   --   --  1.60*  --   --   --   --   --   --   --   HEPARINUNFRC  --   --   --   --  >2.20*  --   --   --   --   --   --   --   --   --  0.98*  CREATININE  --   < >  --   --  1.56* 1.74*  < >  --  1.70*  < > 1.40*  --  1.40* 1.30*  --   TROPONINI  --   --   --   < > 0.31* 0.34*  --   --  0.44*  --  0.35*  --   --   --   --   < > = values in this interval not displayed.    Assessment: 81yo male subtherapeutic on heparin with initial dosing while being cooled s/p Vfib arrest. On Xarelto PTA for Afib. Pharmacy to start IV heparin.  6hr aPTT 79s (goal 66-102s) on 800 units/hr. HL 0.98 - still affected by xarelto - possibly use HL in am if correlate  Hgb 12.6, PLT 336, no bleeding noted.   Goal of Therapy:  aPTT 66-102 seconds   Plan:  Continue heparin drip 800 units/hr  Daily aPTT/HL, CBC Monitor for s/s of bleeding  Levester Fresh, PharmD, BCPS, [facility] Clinical Pharmacist Pager [phone] 08/28/2015 7:37 PM

## 2015-08-28 NOTE — Progress Notes (Signed)
Notified eLink MD of K 3.1 and pt having frequent PVC's. Spoke with Dr. Ashok Cordia received orders for runs of potassium IV. Will continue to monitor pt closely.

## 2015-08-29 ENCOUNTER — Encounter (HOSPITAL_COMMUNITY): Payer: Self-pay | Admitting: Cardiology

## 2015-08-29 ENCOUNTER — Inpatient Hospital Stay (HOSPITAL_COMMUNITY): Payer: Medicare Other

## 2015-08-29 DIAGNOSIS — R570 Cardiogenic shock: Secondary | ICD-10-CM

## 2015-08-29 DIAGNOSIS — G9340 Encephalopathy, unspecified: Secondary | ICD-10-CM

## 2015-08-29 DIAGNOSIS — R918 Other nonspecific abnormal finding of lung field: Secondary | ICD-10-CM

## 2015-08-29 LAB — BASIC METABOLIC PANEL
Anion gap: 13 (ref 5–15)
Anion gap: 12 (ref 5–15)
BUN: 35 mg/dL — AB (ref 6–20)
BUN: 36 mg/dL — AB (ref 6–20)
CALCIUM: 8.3 mg/dL — AB (ref 8.9–10.3)
CHLORIDE: 109 mmol/L (ref 101–111)
CO2: 15 mmol/L — AB (ref 22–32)
CO2: 18 mmol/L — ABNORMAL LOW (ref 22–32)
CREATININE: 1.81 mg/dL — AB (ref 0.61–1.24)
Calcium: 8.2 mg/dL — ABNORMAL LOW (ref 8.9–10.3)
Chloride: 110 mmol/L (ref 101–111)
Creatinine, Ser: 1.55 mg/dL — ABNORMAL HIGH (ref 0.61–1.24)
GFR calc Af Amer: 47 mL/min — ABNORMAL LOW (ref >60)
GFR calc Af Amer: 39 mL/min — ABNORMAL LOW (ref >60)
GFR calc non Af Amer: 34 mL/min — ABNORMAL LOW (ref >60)
GFR, EST NON AFRICAN AMERICAN: 41 mL/min — AB (ref >60)
GLUCOSE: 99 mg/dL (ref 65–99)
GLUCOSE: 112 mg/dL — AB (ref 65–99)
POTASSIUM: 4.4 mmol/L (ref 3.5–5.1)
Potassium: 3.8 mmol/L (ref 3.5–5.1)
SODIUM: 139 mmol/L (ref 135–145)
Sodium: 138 mmol/L (ref 135–145)

## 2015-08-29 LAB — CBC
HCT: 35.3 % — ABNORMAL LOW (ref 39.0–52.0)
HEMOGLOBIN: 11.4 g/dL — AB (ref 13.0–17.0)
MCH: 28 pg (ref 26.0–34.0)
MCHC: 32.3 g/dL (ref 30.0–36.0)
MCV: 86.7 fL (ref 78.0–100.0)
PLATELETS: 333 10*3/uL (ref 150–400)
RBC: 4.07 MIL/uL — AB (ref 4.22–5.81)
RDW: 13.5 % (ref 11.5–15.5)
WBC: 20.6 10*3/uL — ABNORMAL HIGH (ref 4.0–10.5)

## 2015-08-29 LAB — GLUCOSE, CAPILLARY
GLUCOSE-CAPILLARY: 95 mg/dL (ref 65–99)
GLUCOSE-CAPILLARY: 107 mg/dL — AB (ref 65–99)
GLUCOSE-CAPILLARY: 104 mg/dL — AB (ref 65–99)
Glucose-Capillary: 92 mg/dL (ref 65–99)

## 2015-08-29 LAB — HEMOGLOBIN A1C
Hgb A1c MFr Bld: 5.6 % (ref 4.8–5.6)
MEAN PLASMA GLUCOSE: 114 mg/dL

## 2015-08-29 LAB — HEPARIN LEVEL (UNFRACTIONATED): Heparin Unfractionated: 0.8 IU/mL — ABNORMAL HIGH (ref 0.30–0.70)

## 2015-08-29 LAB — APTT: aPTT: 66 seconds — ABNORMAL HIGH (ref 24–37)

## 2015-08-29 MED ORDER — HEPARIN (PORCINE) IN NACL 100-0.45 UNIT/ML-% IJ SOLN
1200.0000 [IU]/h | INTRAMUSCULAR | Status: DC
  Administered 2015-08-30: 1200 [IU]/h via INTRAVENOUS
  Filled 2015-08-29: qty 250

## 2015-08-29 MED ORDER — PRO-STAT SUGAR FREE PO LIQD
60.0000 mL | Freq: Two times a day (BID) | ORAL | Status: DC
  Administered 2015-08-29 – 2015-09-04 (×13): 60 mL
  Filled 2015-08-29 (×13): qty 60

## 2015-08-29 MED ORDER — VITAL HIGH PROTEIN PO LIQD
1000.0000 mL | ORAL | Status: DC
  Administered 2015-08-29 – 2015-09-03 (×7): 1000 mL

## 2015-08-29 NOTE — Progress Notes (Signed)
Patient transported to CT from [place] and back with no complications. RT will continue to monitor.

## 2015-08-29 NOTE — Progress Notes (Signed)
Initial Nutrition Assessment  DOCUMENTATION CODES:   Obesity unspecified  INTERVENTION:   Initiate Vital High Protein @ 20 ml/hr via OG tube and increase by 10 ml every 4 hours to goal rate of 50 ml/hr.   60 ml Prostat BID.    Tube feeding regimen provides 1600 kcal, 165 grams of protein, and 1003 ml of H2O.   NUTRITION DIAGNOSIS:   Inadequate oral intake related to inability to eat as evidenced by NPO status.  GOAL:   Provide needs based on ASPEN/SCCM guidelines  MONITOR:   TF tolerance, Vent status, I & O's  REASON FOR ASSESSMENT:   Consult Enteral/tube feeding initiation and management  ASSESSMENT:     Pt admitted after having Vfib arrest in his car.   Remains intubated Now on normotherapy.   MV: 17 L/min Temp (24hrs), Avg:95.3 F (35.2 C), Min:90.1 F (32.3 C), Max:98.8 F (37.1 C)  Nutrition-Focused physical exam completed. Findings are no fat depletion, no muscle depletion, and mild edema.   Labs reviewed.   Diet Order:  Diet NPO time specified Except for: Sips with Meds  Skin:  Reviewed, no issues  Last BM:  unknown  Height:   Ht Readings from Last 1 Encounters:  08/17/2015 6' (1.829 m)   Weight:   Wt Readings from Last 1 Encounters:  08/28/2015 280 lb (127.007 kg)   Ideal Body Weight:  80.9 kg  BMI:  Body mass index is 37.97 kg/(m^2).  Estimated Nutritional Needs:   Kcal:  [id number]  Protein:  >161 grams  Fluid:  >1.5 L/day  EDUCATION NEEDS:   No education needs identified at this time  [place], [place], [place] Pager [phone] After Hours Pager

## 2015-08-29 NOTE — Progress Notes (Signed)
ANTICOAGULATION CONSULT NOTE - Follow Up Consult  Pharmacy Consult for Heparin Indication: atrial fibrillation  Not on File  Patient Measurements: Height: 6' (182.9 cm) Weight: 280 lb (127.007 kg) IBW/kg (Calculated) : 77.6 Heparin Dosing Weight: 106kg  Vital Signs: Temp: 98.8 F (37.1 C) (02/13 0800) Temp Source: Core (Comment) (02/13 0800) BP: 88/72 mmHg (02/13 0800) Pulse Rate: 106 (02/13 0740)  Labs:  Recent Labs  08/23/2015 1255  08/30/2015 1550  08/31/2015 1800 08/29/2015 2000  09/10/2015 2300 08/28/15 0200  08/28/15 0800 08/28/15 1000  08/28/15 1800 08/28/15 2008 08/28/15 2341 08/29/15 0324  HGB 11.8*  < >  --   --   --   --   --   --   --   --  11.5*  12.6*  --   < >  --  12.9* 12.2* 11.4*  HCT 37.5*  < >  --   --   --   --   --   --   --   --  34.6*  37.0*  --   < >  --  38.0* 36.0* 35.3*  PLT 335  --   --   --   --   --   --   --   --   --  336  --   --   --   --   --  333  APTT  --   < > 91*  --  29  --   --  41* 59*  --   --  104*  --  79*  --   --  66*  LABPROT  --   --  20.7*  --   --   --   --  19.1*  --   --   --   --   --   --   --   --   --   INR  --   --  1.78*  --   --   --   --  1.60*  --   --   --   --   --   --   --   --   --   HEPARINUNFRC  --   --   --   --  >2.20*  --   --   --   --   --   --   --   --  0.98*  --   --  0.80*  CREATININE  --   < >  --   --  1.56* 1.74*  < >  --  1.70*  < > 1.40*  --   < >  --  1.50* 1.50* 1.55*  TROPONINI  --   --   --   < > 0.31* 0.34*  --   --  0.44*  --  0.35*  --   --   --   --   --   --   < > = values in this interval not displayed.  Estimated Creatinine Clearance: 53.2 mL/min (by C-G formula based on Cr of 1.55).   Medications:  Heparin @ 800 units/hr  Assessment: 81yom on xarelto pta for afib, transitioned to IV heparin s/p cardiac arrest. Underwent hypothermia protocol - rewarming completed yesterday ~ 1400. APTT today is therapeutic on the low end at 66 seconds, heparin level remains elevated at 0.80  indicating xarelto still on board (last dose 2/10). Hgb trending down, platelets stable. No bleeding reported.  Goal of Therapy:  APTT 66-102 seconds Heparin level 0.3-0.7 units/ml Monitor platelets by anticoagulation protocol: Yes   Plan:  1) Increase heparin to 850 units/hr 2) Heparin level and aPTT in AM   Deboraha Sprang 08/29/2015,8:43 AM

## 2015-08-29 NOTE — Progress Notes (Signed)
EEG completed; results pending.    

## 2015-08-29 NOTE — Progress Notes (Signed)
Patient awoke and became agitated trying to sit up in bed about 1 hour after turning the nimbex off. Heart went up to 120's and he was having very frequent pvc's. He was able to nod his head appropriately to questions. He was able to move all four extremeties but he did not squeeze my hand upon command. A fentanyl bolus was given to calm him down. Dr. Jimmy Footman notified and gave verbal telephone request to keep fentanyl and versed on and to let CCM decide when and how to wean off sedation.

## 2015-08-29 NOTE — Progress Notes (Signed)
Subjective:  Intubated and sedation held this morning, was agitated last night.   Objective:  Vital Signs in the last 24 hours: Temp:  [89.4 F (31.9 C)-98.8 F (37.1 C)] 98.8 F (37.1 C) (02/13 0900) Pulse Rate:  [43-106] 100 (02/13 0900) Resp:  [30] 30 (02/13 0900) BP: (84-152)/(54-98) 92/68 mmHg (02/13 0900) SpO2:  [100 %] 100 % (02/13 0900) Arterial Line BP: (74-180)/(46-93) 117/54 mmHg (02/13 0900) FiO2 (%):  [40 %-50 %] 40 % (02/13 0900)  Intake/Output from previous day: 02/12 0701 - 02/13 0700 In: 2819.1 [I.V.:2494.1; NG/GT:175; IV Piggyback:150] Out: 364 [Urine:289; Emesis/NG output:75]  Physical Exam: General appearance: Patient is intubated, is waking up and sedation is now off. Being rewarmed.  Lungs: clear to auscultation bilaterally Chest wall: Chest appears normal, barrel-shaped. No trauma. Heart: Distant heart sounds, murmurs could not be made out. No gallop appreciated. Abdomen: soft, non-tender; bowel sounds normal; no masses, no organomegaly and Obese Extremities: extremities normal, atraumatic, no cyanosis or edema Pulses: Faint pulses, JVD could not be made out due to central line placement and patient being intubated. Right radial access site healthy and now has an arterial line. Neurologically: Able to recognize me, followed commands with hand squeeze.  Lab Results: BMP  Recent Labs  08/28/15 0600  08/28/15 2341 08/29/15 0324 08/29/15 0800  NA 139  < > 140 138 139  K 4.5  < > 3.7 3.8 4.4  CL 110  < > 111 110 109  CO2 16*  --   --  15* 18*  GLUCOSE 169*  < > 108* 99 112*  BUN 30*  < > 32* 35* 36*  CREATININE 1.63*  < > 1.50* 1.55* 1.81*  CALCIUM 9.0  --   --  8.3* 8.2*  GFRNONAA 38*  --   --  41* 34*  GFRAA 45*  --   --  47* 39*  < > = values in this interval not displayed.  CBC  Recent Labs Lab 09/06/2015 1255  08/29/15 0324  WBC 12.2*  < > 20.6*  RBC 4.00*  < > 4.07*  HGB 11.8*  < > 11.4*  HCT 37.5*  < > 35.3*  PLT 335  < > 333  MCV  93.8  < > 86.7  MCH 29.5  < > 28.0  MCHC 31.5  < > 32.3  RDW 13.2  < > 13.5  LYMPHSABS 3.7  --   --   MONOABS 0.5  --   --   EOSABS 0.2  --   --   BASOSABS 0.1  --   --   < > = values in this interval not displayed.  Cardiac Panel (last 3 results)  Recent Labs  09/09/2015 2000 08/28/15 0200 08/28/15 0800  TROPONINI 0.34* 0.44* 0.35*   Cardiac Studies:  EKG:  Sinus pericardia with first-degree AV block at a rate of 50 beats a minute, low-voltage comp access. Diffuse nonspecific ST depression with T-wave inversion noted globally, prolonged QT interval.  Scheduled Meds: . sodium chloride  2,000 mL Intravenous Once  . antiseptic oral rinse  7 mL Mouth Rinse 10 times per day  . artificial tears  1 application Both Eyes 3 times per day  . aspirin  81 mg Oral Daily  . atorvastatin  80 mg Oral q1800  . chlorhexidine gluconate  15 mL Mouth Rinse BID  . cisatracurium  0.1 mg/kg Intravenous Once  . fentaNYL (SUBLIMAZE) injection  100 mcg Intravenous Once  . fentaNYL (SUBLIMAZE) injection  50  mcg Intravenous Once  . [START ON 08/31/2015] Influenza vac split quadrivalent PF  0.5 mL Intramuscular Tomorrow-1000  . insulin aspart  0-9 Units Subcutaneous 6 times per day  . metoprolol  5 mg Intravenous 4 times per day  . midazolam  1 mg Intravenous Once  . midazolam  2 mg Intravenous Once  . pantoprazole (PROTONIX) IV  40 mg Intravenous Q24H  . [START ON 08/31/2015] pneumococcal 23 valent vaccine  0.5 mL Intramuscular Tomorrow-1000  . sodium chloride flush  10-40 mL Intracatheter Q12H  . sodium chloride flush  3 mL Intravenous Q12H   Continuous Infusions: . sodium chloride 10 mL/hr at 09/03/2015 2122  . sodium chloride    . sodium chloride Stopped (08/28/15 0958)  . sodium chloride 75 mL/hr at 08/29/15 0800  . cisatracurium (NIMBEX) infusion Stopped (08/29/15 0400)  . fentaNYL infusion INTRAVENOUS 125 mcg/hr (08/28/15 2000)  . heparin 850 Units/hr (08/29/15 0900)  . midazolam (VERSED)  infusion 2 mg/hr (08/29/15 0424)  . norepinephrine (LEVOPHED) Adult infusion 3 mcg/min (08/29/15 0800)   PRN Meds:.sodium chloride, sodium chloride, sodium chloride, amiodarone (NEXTERONE) IV bolus only 150 mg/100 mL, calcium chloride, cisatracurium **AND** cisatracurium (NIMBEX) infusion **AND** cisatracurium, fentaNYL, fentaNYL, fentaNYL (SUBLIMAZE) injection, midazolam, midazolam, midazolam, sodium chloride flush, sodium chloride flush   Assessment/Plan:  1. Ischemic and nonischemic cardiomyopathy with acute systolic and diastolic heart failure, no significant leak in cardiac enzymes to suggest ischemic etiology.  2. Ventricular fibrillation arrest, probably precipitated by cardiomyopathy. 3.  LV systolic dysfunction due to CPR and need for defibrillation could also be the etiology for cardiomyopathy. 4. Coronary artery disease with a moderate sized AV groove branch revealing ulcerated 80% stenosis, mid 3 flow hence left alone. This could have precipitated arrhythmia. 5. Chronic atrial fibrillation, now in sinus rhythm with first-degree AV block CHA2DS2-VASCScore: Risk Score  6 (CHF and new DM),  Yearly risk of stroke  9.8.  6. Chronic long-term anticoagulation on Xarelto, presently on IV heparin as patient may need EP evaluation and possible need for ICD once he is extubated and stable. 7. Diabetes mellitus new, cannot exclude stress-induced diabetes. 8. Acute on chronic renal failure due to C. Arrest and ischemic insult.  Recommendation: He has maintained adequate perfusion and vital signs remained stable without any recurrence of arrhythmias. Neurologically now appears to be responding appropriately. I will repeat echocardiogram.  Will need EP consult once medically stable. BP is soft to add beta blockers and ACEi on hold due to ARF. Continue IV heparin for now.  Adrian Prows, M.D. 08/29/2015, 9:52 AM [facility] Pager: [phone] Office: [phone] If no answer:  [phone]
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
## 2015-08-29 NOTE — Procedures (Signed)
TECHNICAL SUMMARY:  A multichannel referential and bipolar montage EEG using the standard international 10-20 system was performed on the patient described as on versed, but responsive to commands.  The dominant background activity consists of 7-8 hertz activity seen most prominantly over the posterior head region.  Low voltage fast (beta) activity is distributed symmetrically and maximally over the anterior head regions.  ACTIVATION:  Stepwise photic stimulation and hyperventilation were not completed.  EPILEPTIFORM ACTIVITY:  There were no spikes, sharp waves or paroxysmal activity.  SLEEP:  Physiologic drowsiness is noted, but no sleep  CARDIAC:  The EKG lead revealed artifact and is not well recorded  IMPRESSION:  This is an abnormal EEG demonstrating a very mild diffuse slowing of electrocerebral activity.  This can be seen in a wide variety of encephalopathic state including those of a toxic, metabolic, or degenerative nature.  There were no focal, hemispheric, or lateralizing features.  No epileptiform activity was recorded.  A normal EEG does not exclude the diagnosis of a seizure disorder.

## 2015-08-30 ENCOUNTER — Inpatient Hospital Stay (HOSPITAL_COMMUNITY): Payer: Medicare Other

## 2015-08-30 DIAGNOSIS — G931 Anoxic brain damage, not elsewhere classified: Secondary | ICD-10-CM

## 2015-08-30 DIAGNOSIS — J810 Acute pulmonary edema: Secondary | ICD-10-CM

## 2015-08-30 LAB — BASIC METABOLIC PANEL
ANION GAP: 12 (ref 5–15)
BUN: 47 mg/dL — AB (ref 6–20)
CHLORIDE: 111 mmol/L (ref 101–111)
CO2: 17 mmol/L — ABNORMAL LOW (ref 22–32)
Calcium: 8.1 mg/dL — ABNORMAL LOW (ref 8.9–10.3)
Creatinine, Ser: 2 mg/dL — ABNORMAL HIGH (ref 0.61–1.24)
GFR calc Af Amer: 35 mL/min — ABNORMAL LOW (ref >60)
GFR, EST NON AFRICAN AMERICAN: 30 mL/min — AB (ref >60)
Glucose, Bld: 111 mg/dL — ABNORMAL HIGH (ref 65–99)
POTASSIUM: 4.8 mmol/L (ref 3.5–5.1)
SODIUM: 140 mmol/L (ref 135–145)

## 2015-08-30 LAB — POCT I-STAT 3, ART BLOOD GAS (G3+)
ACID-BASE DEFICIT: 9 mmol/L — AB (ref 0.0–2.0)
Bicarbonate: 17.8 mEq/L — ABNORMAL LOW (ref 20.0–24.0)
O2 Saturation: 99 %
PH ART: 7.233 — AB (ref 7.350–7.450)
TCO2: 19 mmol/L (ref 0–100)
pCO2 arterial: 42.1 mmHg (ref 35.0–45.0)
pO2, Arterial: 152 mmHg — ABNORMAL HIGH (ref 80.0–100.0)

## 2015-08-30 LAB — MAGNESIUM: MAGNESIUM: 1.7 mg/dL (ref 1.7–2.4)

## 2015-08-30 LAB — GLUCOSE, CAPILLARY
GLUCOSE-CAPILLARY: 98 mg/dL (ref 65–99)
GLUCOSE-CAPILLARY: 103 mg/dL — AB (ref 65–99)
GLUCOSE-CAPILLARY: 85 mg/dL (ref 65–99)
Glucose-Capillary: 90 mg/dL (ref 65–99)
Glucose-Capillary: 104 mg/dL — ABNORMAL HIGH (ref 65–99)

## 2015-08-30 LAB — CBC
HCT: 33.4 % — ABNORMAL LOW (ref 39.0–52.0)
HEMOGLOBIN: 10.9 g/dL — AB (ref 13.0–17.0)
MCH: 29.7 pg (ref 26.0–34.0)
MCHC: 32.6 g/dL (ref 30.0–36.0)
MCV: 91 fL (ref 78.0–100.0)
Platelets: 301 10*3/uL (ref 150–400)
RBC: 3.67 MIL/uL — AB (ref 4.22–5.81)
RDW: 14.2 % (ref 11.5–15.5)
WBC: 25.7 10*3/uL — AB (ref 4.0–10.5)

## 2015-08-30 LAB — HEPARIN LEVEL (UNFRACTIONATED)
Heparin Unfractionated: 0.18 IU/mL — ABNORMAL LOW (ref 0.30–0.70)
Heparin Unfractionated: 0.26 IU/mL — ABNORMAL LOW (ref 0.30–0.70)

## 2015-08-30 LAB — PHOSPHORUS: PHOSPHORUS: 5 mg/dL — AB (ref 2.5–4.6)

## 2015-08-30 LAB — APTT: APTT: 38 s — AB (ref 24–37)

## 2015-08-30 MED ORDER — HEPARIN BOLUS VIA INFUSION
1500.0000 [IU] | Freq: Once | INTRAVENOUS | Status: AC
  Administered 2015-08-30: 1500 [IU] via INTRAVENOUS
  Filled 2015-08-30: qty 1500

## 2015-08-30 MED ORDER — HEPARIN (PORCINE) IN NACL 100-0.45 UNIT/ML-% IJ SOLN
1400.0000 [IU]/h | INTRAMUSCULAR | Status: DC
  Administered 2015-08-30: 1400 [IU]/h via INTRAVENOUS
  Filled 2015-08-30: qty 250

## 2015-08-30 MED ORDER — ACETAMINOPHEN 160 MG/5ML PO SOLN
650.0000 mg | ORAL | Status: DC | PRN
  Administered 2015-08-30 – 2015-08-31 (×5): 650 mg
  Filled 2015-08-30 (×5): qty 20.3

## 2015-08-30 MED ORDER — FUROSEMIDE 10 MG/ML IJ SOLN
40.0000 mg | Freq: Four times a day (QID) | INTRAMUSCULAR | Status: AC
  Administered 2015-08-30 (×3): 40 mg via INTRAVENOUS
  Filled 2015-08-30 (×3): qty 4

## 2015-08-30 MED ORDER — FENTANYL CITRATE (PF) 100 MCG/2ML IJ SOLN
25.0000 ug | INTRAMUSCULAR | Status: DC | PRN
  Administered 2015-08-30 – 2015-08-31 (×5): 50 ug via INTRAVENOUS
  Administered 2015-09-01 (×2): 25 ug via INTRAVENOUS
  Administered 2015-09-02 – 2015-09-03 (×3): 50 ug via INTRAVENOUS
  Administered 2015-09-03: 25 ug via INTRAVENOUS
  Administered 2015-09-04 (×2): 50 ug via INTRAVENOUS
  Filled 2015-08-30 (×14): qty 2

## 2015-08-30 MED ORDER — HEPARIN BOLUS VIA INFUSION
2000.0000 [IU] | Freq: Once | INTRAVENOUS | Status: AC
  Administered 2015-08-30: 2000 [IU] via INTRAVENOUS
  Filled 2015-08-30: qty 2000

## 2015-08-30 MED ORDER — MAGNESIUM SULFATE 2 GM/50ML IV SOLN
2.0000 g | Freq: Once | INTRAVENOUS | Status: AC
  Administered 2015-08-30: 2 g via INTRAVENOUS
  Filled 2015-08-30: qty 50

## 2015-08-30 NOTE — Progress Notes (Signed)
ANTICOAGULATION CONSULT NOTE - Follow Up Consult  Pharmacy Consult for heparin Indication: atrial fibrillation and s/p Vfib arrest w/ presumed CAD   Labs:  Recent Labs  08/31/2015 1255  08/20/2015 1550  08/22/2015 2000  09/02/2015 2300 08/28/15 0200  08/28/15 0800  08/28/15 1800 08/28/15 2008 08/28/15 2341 08/29/15 0324 08/29/15 0800 08/30/15 0434  HGB 11.8*  < >  --   --   --   --   --   --   --  11.5*  12.6*  < >  --  12.9* 12.2* 11.4*  --   --   HCT 37.5*  < >  --   --   --   --   --   --   --  34.6*  37.0*  < >  --  38.0* 36.0* 35.3*  --   --   PLT 335  --   --   --   --   --   --   --   --  336  --   --   --   --  333  --   --   APTT  --   --  91*  < >  --   --  41* 59*  --   --   < > 79*  --   --  66*  --  38*  LABPROT  --   --  20.7*  --   --   --  19.1*  --   --   --   --   --   --   --   --   --   --   INR  --   --  1.78*  --   --   --  1.60*  --   --   --   --   --   --   --   --   --   --   HEPARINUNFRC  --   --   --   < >  --   --   --   --   --   --   --  0.98*  --   --  0.80*  --  0.18*  CREATININE  --   < >  --   < > 1.74*  < >  --  1.70*  < > 1.40*  < >  --  1.50* 1.50* 1.55* 1.81*  --   TROPONINI  --   --   --   < > 0.34*  --   --  0.44*  --  0.35*  --   --   --   --   --   --   --   < > = values in this interval not displayed.    Assessment: 81yo male subtherapeutic on heparin despite rate increase yesterday am, fully rewarmed.  Goal of Therapy:  aPTT 66-102 seconds   Plan:  Will give small heparin bolus of 2000 units x1 and increase gtt by 3 units/kg/hr to 1200 units/hr and check PTT in 8hr.  Wynona Neat, PharmD, BCPS  08/30/2015,4:59 AM

## 2015-08-30 NOTE — Progress Notes (Signed)
Fentanyl gtt 127mL and Versed 12mL wasted in sink, witnessed x2 RNs (H. Jheremy Boger, RN and L. Chatman, RN).

## 2015-08-30 NOTE — Progress Notes (Signed)
ANTICOAGULATION CONSULT NOTE - Follow Up Consult  Pharmacy Consult for heparin Indication: atrial fibrillation and s/p Vfib arrest w/ presumed CAD   Labs:  Recent Labs  08/26/2015 1550  08/20/2015 2000  08/31/2015 2300 08/28/15 0200  08/28/15 0800  08/28/15 1800  08/28/15 2341 08/29/15 0324 08/29/15 0800 08/30/15 0433 08/30/15 0434 08/30/15 1245  HGB  --   --   --   --   --   --   --  11.5*  12.6*  < >  --   < > 12.2* 11.4*  --  10.9*  --   --   HCT  --   --   --   --   --   --   --  34.6*  37.0*  < >  --   < > 36.0* 35.3*  --  33.4*  --   --   PLT  --   --   --   --   --   --   --  336  --   --   --   --  333  --  301  --   --   APTT 91*  < >  --   --  41* 59*  --   --   < > 79*  --   --  66*  --   --  38*  --   LABPROT 20.7*  --   --   --  19.1*  --   --   --   --   --   --   --   --   --   --   --   --   INR 1.78*  --   --   --  1.60*  --   --   --   --   --   --   --   --   --   --   --   --   HEPARINUNFRC  --   < >  --   --   --   --   --   --   --  0.98*  --   --  0.80*  --   --  0.18* 0.26*  CREATININE  --   < > 1.74*  < >  --  1.70*  < > 1.40*  < >  --   < > 1.50* 1.55* 1.81* 2.00*  --   --   TROPONINI  --   < > 0.34*  --   --  0.44*  --  0.35*  --   --   --   --   --   --   --   --   --   < > = values in this interval not displayed.    Assessment: 81yo male admitted 08/30/2015 after cardiac arrest. On xarelto PTA for Afib. Transitioned to heparin IV.   HL subtherapeutic (0.26) on 1200 units/hr. H/H stable, PLT wnl, no bleeding noted.    Goal of Therapy:  HL 0.3 - 0.7  Plan:  Heparin 1500 units bolus x1 Increase Heparin to 1400 units/hr 8hr HL Daily HL/CBC when indicated Monitor for s/s of bleeding  Garcia Dalzell C. Lennox Grumbles, PharmD Pharmacy Resident  Pager: [phone] 08/30/2015 2:03 PM
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
## 2015-08-30 NOTE — Progress Notes (Signed)
Subjective:  Intubated and sedated, plan to continue vent support today. Objective:  Vital Signs in the last 24 hours: Temp:  [98.1 F (36.7 C)-99.7 F (37.6 C)] 98.8 F (37.1 C) (02/14 1200) Pulse Rate:  [91-114] 92 (02/14 1300) Resp:  [15-34] 19 (02/14 1300) BP: (94-144)/(46-82) 144/51 mmHg (02/14 1228) SpO2:  [100 %] 100 % (02/14 1300) Arterial Line BP: (100-158)/(50-63) 117/54 mmHg (02/13 2100) FiO2 (%):  [40 %] 40 % (02/14 1300) Weight:  [102.56 kg (226 lb 1.7 oz)] 102.56 kg (226 lb 1.7 oz) (02/14 0500)  Intake/Output from previous day: 02/13 0701 - 02/14 0700 In: 2847.2 [I.V.:2421.7; NG/GT:425.5] Out: 265 [Urine:265]  Blood pressure 144/51, pulse 92, temperature 98.8 F (37.1 C), temperature source Core (Comment), resp. rate 19, height 6' (1.829 m), weight 102.56 kg (226 lb 1.7 oz), SpO2 100 %.  Physical Exam: General appearance: Patient is intubated, is waking up and sedation is now off. Being rewarmed.  Lungs: clear to auscultation bilaterally Chest wall: Chest appears normal, barrel-shaped. No trauma. Heart: Distant heart sounds, murmurs could not be made out. No gallop appreciated. Abdomen: soft, non-tender; bowel sounds normal; no masses, no organomegaly and Obese Extremities: extremities normal, atraumatic, no cyanosis or edema Pulses:Normal peripheral pulses. JVD could not be made out due to central line placement and patient being intubated. Lab Results: BMP  Recent Labs  08/29/15 0324 08/29/15 0800 08/30/15 0433  NA 138 139 140  K 3.8 4.4 4.8  CL 110 109 111  CO2 15* 18* 17*  GLUCOSE 99 112* 111*  BUN 35* 36* 47*  CREATININE 1.55* 1.81* 2.00*  CALCIUM 8.3* 8.2* 8.1*  GFRNONAA 41* 34* 30*  GFRAA 47* 39* 35*    CBC  Recent Labs Lab 08/23/2015 1255  08/30/15 0433  WBC 12.2*  < > 25.7*  RBC 4.00*  < > 3.67*  HGB 11.8*  < > 10.9*  HCT 37.5*  < > 33.4*  PLT 335  < > 301  MCV 93.8  < > 91.0  MCH 29.5  < > 29.7  MCHC 31.5  < > 32.6  RDW 13.2  <  > 14.2  LYMPHSABS 3.7  --   --   MONOABS 0.5  --   --   EOSABS 0.2  --   --   BASOSABS 0.1  --   --   < > = values in this interval not displayed.  Cardiac Panel (last 3 results)  Recent Labs  08/31/2015 2000 08/28/15 0200 08/28/15 0800  TROPONINI 0.34* 0.44* 0.35*   Cardiac Studies:  EKG 08/28/2015:  Sinus tachycardia with first-degree AV block at a rate of 50 beats a minute, low-voltage comp access. Diffuse nonspecific ST depression with T-wave inversion noted globally, prolonged QT interval.  Scheduled Meds: . antiseptic oral rinse  7 mL Mouth Rinse 10 times per day  . aspirin  81 mg Oral Daily  . atorvastatin  80 mg Oral q1800  . chlorhexidine gluconate  15 mL Mouth Rinse BID  . feeding supplement (PRO-STAT SUGAR FREE 64)  60 mL Per Tube BID  . furosemide  40 mg Intravenous Q6H  . heparin  1,500 Units Intravenous Once  . [START ON 08/31/2015] Influenza vac split quadrivalent PF  0.5 mL Intramuscular Tomorrow-1000  . insulin aspart  0-9 Units Subcutaneous 6 times per day  . metoprolol  5 mg Intravenous 4 times per day  . pantoprazole (PROTONIX) IV  40 mg Intravenous Q24H  . [START ON 08/31/2015] pneumococcal 23 valent vaccine  0.5 mL Intramuscular Tomorrow-1000  . sodium chloride flush  10-40 mL Intracatheter Q12H  . sodium chloride flush  3 mL Intravenous Q12H   Continuous Infusions: . sodium chloride 10 mL/hr at 08/30/15 1130  . feeding supplement (VITAL HIGH PROTEIN) 1,000 mL (08/30/15 0227)  . fentaNYL infusion INTRAVENOUS Stopped (08/30/15 0823)  . heparin    . midazolam (VERSED) infusion Stopped (08/30/15 0750)  . norepinephrine (LEVOPHED) Adult infusion Stopped (08/30/15 0750)   PRN Meds:.sodium chloride, fentaNYL, fentaNYL (SUBLIMAZE) injection, sodium chloride flush, sodium chloride flush   Assessment/Plan:  1. Ischemic and nonischemic cardiomyopathy with acute systolic and diastolic heart failure, no significant leak in cardiac enzymes to suggest ischemic  etiology.  2. Ventricular fibrillation arrest, probably precipitated by cardiomyopathy. 3.  LV systolic dysfunction due to CPR and need for defibrillation could also be the etiology for cardiomyopathy. 4. Coronary artery disease with a moderate sized AV groove branch revealing ulcerated 80% stenosis, mid 3 flow hence left alone. This could have precipitated arrhythmia. 5. Chronic atrial fibrillation, now in sinus rhythm with first-degree AV block CHA2DS2-VASCScore: Risk Score  6 (CHF and new DM),  Yearly risk of stroke  9.8.  6. Chronic long-term anticoagulation on Xarelto, presently on IV heparin as patient may need EP evaluation and possible need for ICD once he is extubated and stable. 7. Diabetes mellitus new, cannot exclude stress-induced diabetes. 8. Acute on chronic renal failure due to C. Arrest and ischemic insult. 9. CT chest  Posterior left upper lobe 5.2 x 3.8 cm mass compatible with lung cancer, with adjacent peripheral left upper lobe partial collapse/ consolidation.   Recommendation: He has maintained adequate perfusion and vital signs remained stable without any recurrence of arrhythmias. Neurologically now appears to be responding appropriately. Will need EP consult once medically stable. Continue IV lopressor for now. Agree with diuresis.   Adrian Prows, M.D. 08/30/2015, 2:14 PM [facility] Pager: [phone] Office: [phone] If no answer: [phone]

## 2015-08-30 NOTE — Progress Notes (Signed)
[facility] Progress Note Patient Name: Jerry Chase DOB: 11-25-1934 MRN: [id number]   Date of Service  08/30/2015  HPI/Events of Note  Post hypothermia Cooling water dropping   eICU Interventions  APAP prn     Intervention Category Intermediate Interventions: Other: (FEVER)  Simonne Maffucci 08/30/2015, 4:56 PM

## 2015-08-31 ENCOUNTER — Inpatient Hospital Stay (HOSPITAL_COMMUNITY): Payer: Medicare Other

## 2015-08-31 DIAGNOSIS — J810 Acute pulmonary edema: Secondary | ICD-10-CM

## 2015-08-31 DIAGNOSIS — J189 Pneumonia, unspecified organism: Secondary | ICD-10-CM

## 2015-08-31 DIAGNOSIS — I4901 Ventricular fibrillation: Secondary | ICD-10-CM | POA: Diagnosis not present

## 2015-08-31 DIAGNOSIS — J9601 Acute respiratory failure with hypoxia: Secondary | ICD-10-CM

## 2015-08-31 LAB — POCT I-STAT 3, ART BLOOD GAS (G3+)
Acid-base deficit: 5 mmol/L — ABNORMAL HIGH (ref 0.0–2.0)
Bicarbonate: 22.2 mEq/L (ref 20.0–24.0)
O2 Saturation: 100 %
PCO2 ART: 50.8 mmHg — AB (ref 35.0–45.0)
PH ART: 7.248 — AB (ref 7.350–7.450)
Patient temperature: 36.9
TCO2: 24 mmol/L (ref 0–100)
pO2, Arterial: 261 mmHg — ABNORMAL HIGH (ref 80.0–100.0)

## 2015-08-31 LAB — CBC
HEMATOCRIT: 33 % — AB (ref 39.0–52.0)
HEMOGLOBIN: 10.4 g/dL — AB (ref 13.0–17.0)
MCH: 28.6 pg (ref 26.0–34.0)
MCHC: 31.5 g/dL (ref 30.0–36.0)
MCV: 90.7 fL (ref 78.0–100.0)
Platelets: 245 10*3/uL (ref 150–400)
RBC: 3.64 MIL/uL — ABNORMAL LOW (ref 4.22–5.81)
RDW: 14 % (ref 11.5–15.5)
WBC: 24.9 10*3/uL — AB (ref 4.0–10.5)

## 2015-08-31 LAB — HEPARIN LEVEL (UNFRACTIONATED)
HEPARIN UNFRACTIONATED: 0.31 [IU]/mL (ref 0.30–0.70)
HEPARIN UNFRACTIONATED: 0.3 [IU]/mL (ref 0.30–0.70)

## 2015-08-31 LAB — BASIC METABOLIC PANEL
ANION GAP: 12 (ref 5–15)
BUN: 73 mg/dL — AB (ref 6–20)
CO2: 19 mmol/L — ABNORMAL LOW (ref 22–32)
Calcium: 8.1 mg/dL — ABNORMAL LOW (ref 8.9–10.3)
Chloride: 108 mmol/L (ref 101–111)
Creatinine, Ser: 1.63 mg/dL — ABNORMAL HIGH (ref 0.61–1.24)
GFR calc Af Amer: 45 mL/min — ABNORMAL LOW (ref >60)
GFR, EST NON AFRICAN AMERICAN: 38 mL/min — AB (ref >60)
Glucose, Bld: 142 mg/dL — ABNORMAL HIGH (ref 65–99)
POTASSIUM: 4.1 mmol/L (ref 3.5–5.1)
SODIUM: 139 mmol/L (ref 135–145)

## 2015-08-31 LAB — URINALYSIS, ROUTINE W REFLEX MICROSCOPIC
Bilirubin Urine: NEGATIVE
GLUCOSE, UA: NEGATIVE mg/dL
Hgb urine dipstick: NEGATIVE
Ketones, ur: NEGATIVE mg/dL
Nitrite: NEGATIVE
PROTEIN: NEGATIVE mg/dL
SPECIFIC GRAVITY, URINE: 1.016 (ref 1.005–1.030)
pH: 5 (ref 5.0–8.0)

## 2015-08-31 LAB — GLUCOSE, CAPILLARY
GLUCOSE-CAPILLARY: 120 mg/dL — AB (ref 65–99)
Glucose-Capillary: 119 mg/dL — ABNORMAL HIGH (ref 65–99)
Glucose-Capillary: 144 mg/dL — ABNORMAL HIGH (ref 65–99)
Glucose-Capillary: 124 mg/dL — ABNORMAL HIGH (ref 65–99)
Glucose-Capillary: 127 mg/dL — ABNORMAL HIGH (ref 65–99)
Glucose-Capillary: 106 mg/dL — ABNORMAL HIGH (ref 65–99)
Glucose-Capillary: 112 mg/dL — ABNORMAL HIGH (ref 65–99)

## 2015-08-31 LAB — URINE MICROSCOPIC-ADD ON

## 2015-08-31 LAB — PROCALCITONIN: PROCALCITONIN: 1.46 ng/mL

## 2015-08-31 LAB — APTT: APTT: 65 s — AB (ref 24–37)

## 2015-08-31 LAB — PHOSPHORUS: PHOSPHORUS: 4.4 mg/dL (ref 2.5–4.6)

## 2015-08-31 LAB — MAGNESIUM: MAGNESIUM: 2.1 mg/dL (ref 1.7–2.4)

## 2015-08-31 MED ORDER — METOPROLOL TARTRATE 1 MG/ML IV SOLN
5.0000 mg | INTRAVENOUS | Status: DC
Start: 2015-08-31 — End: 2015-09-05
  Administered 2015-08-31 – 2015-09-04 (×14): 5 mg via INTRAVENOUS
  Filled 2015-08-31 (×14): qty 5

## 2015-08-31 MED ORDER — VANCOMYCIN HCL 10 G IV SOLR
1250.0000 mg | INTRAVENOUS | Status: DC
  Administered 2015-09-01 – 2015-09-03 (×3): 1250 mg via INTRAVENOUS
  Filled 2015-08-31 (×4): qty 1250

## 2015-08-31 MED ORDER — HEPARIN (PORCINE) IN NACL 100-0.45 UNIT/ML-% IJ SOLN
1500.0000 [IU]/h | INTRAMUSCULAR | Status: DC
  Administered 2015-08-31 – 2015-09-04 (×5): 1500 [IU]/h via INTRAVENOUS
  Filled 2015-08-31 (×6): qty 250

## 2015-08-31 MED ORDER — DIGOXIN 125 MCG PO TABS
0.1250 mg | ORAL_TABLET | Freq: Every day | ORAL | Status: DC
  Administered 2015-09-01: 0.125 mg via ORAL
  Filled 2015-08-31: qty 1

## 2015-08-31 MED ORDER — DILTIAZEM HCL 100 MG IV SOLR
10.0000 mg/h | INTRAVENOUS | Status: DC
  Administered 2015-08-31: 10 mg/h via INTRAVENOUS
  Filled 2015-08-31: qty 100

## 2015-08-31 MED ORDER — PIPERACILLIN-TAZOBACTAM 3.375 G IVPB
3.3750 g | Freq: Three times a day (TID) | INTRAVENOUS | Status: DC
  Administered 2015-08-31 – 2015-09-01 (×4): 3.375 g via INTRAVENOUS
  Filled 2015-08-31 (×6): qty 50

## 2015-08-31 MED ORDER — DIGOXIN 0.25 MG/ML IJ SOLN
0.2500 mg | Freq: Four times a day (QID) | INTRAMUSCULAR | Status: AC
  Administered 2015-08-31 – 2015-09-01 (×2): 0.25 mg via INTRAVENOUS
  Filled 2015-08-31 (×2): qty 2

## 2015-08-31 MED ORDER — SODIUM CHLORIDE 0.9 % IV SOLN: 1250.0000 mg | INTRAVENOUS | Status: DC

## 2015-08-31 MED ORDER — HYDRALAZINE HCL 20 MG/ML IJ SOLN
10.0000 mg | Freq: Three times a day (TID) | INTRAMUSCULAR | Status: DC
  Administered 2015-08-31 – 2015-09-04 (×10): 10 mg via INTRAVENOUS
  Filled 2015-08-31 (×10): qty 1

## 2015-08-31 MED ORDER — DIGOXIN 0.25 MG/ML IJ SOLN
0.5000 mg | Freq: Once | INTRAMUSCULAR | Status: AC
  Administered 2015-08-31: 0.5 mg via INTRAVENOUS
  Filled 2015-08-31: qty 2

## 2015-08-31 MED ORDER — FUROSEMIDE 10 MG/ML IJ SOLN
40.0000 mg | Freq: Four times a day (QID) | INTRAMUSCULAR | Status: AC
  Administered 2015-08-31 – 2015-09-01 (×2): 40 mg via INTRAVENOUS
  Filled 2015-08-31 (×2): qty 4

## 2015-08-31 MED ORDER — AMIODARONE HCL IN DEXTROSE 360-4.14 MG/200ML-% IV SOLN
60.0000 mg/h | INTRAVENOUS | Status: AC
Start: 2015-08-31 — End: 2015-09-01
  Administered 2015-08-31: 60 mg/h via INTRAVENOUS
  Filled 2015-08-31: qty 200

## 2015-08-31 MED ORDER — PANTOPRAZOLE SODIUM 40 MG PO PACK
40.0000 mg | PACK | ORAL | Status: DC
  Administered 2015-08-31 – 2015-09-03 (×4): 40 mg
  Filled 2015-08-31 (×4): qty 20

## 2015-08-31 MED ORDER — AMIODARONE HCL IN DEXTROSE 360-4.14 MG/200ML-% IV SOLN
30.0000 mg/h | INTRAVENOUS | Status: DC
  Administered 2015-09-01 – 2015-09-04 (×6): 30 mg/h via INTRAVENOUS
  Filled 2015-08-31 (×8): qty 200

## 2015-08-31 MED ORDER — SODIUM CHLORIDE 0.9 % IV SOLN
2000.0000 mg | Freq: Once | INTRAVENOUS | Status: AC
  Administered 2015-08-31: 2000 mg via INTRAVENOUS
  Filled 2015-08-31: qty 2000

## 2015-08-31 NOTE — Progress Notes (Signed)
Pharmacy Antibiotic Note  Jerry Chase is a 81 y.o. male admitted on 08/20/2015 with STEMI. Pharmacy has been consulted for Vancomycin and Zosyn dosing for suspected sepsis. WBC 24.9, afebrile  Plan:  Zosyn 3.375g IV Q8h (4 hr inf) Vancomycin 2000mg  IV x 1, then 1250mg  IV Q12h  F/U c/s, renal fxn, LOT, VT @ ss  Height: 6' (182.9 cm) Weight: 221 lb 11.1 oz (100.56 kg) IBW/kg (Calculated) : 77.6  Temp (24hrs), Avg:98.8 F (37.1 C), Min:97.9 F (36.6 C), Max:99.1 F (37.3 C)   Recent Labs Lab 08/19/2015 1255  09/03/2015 1356  08/25/2015 1800  08/20/2015 2120  08/28/15 0655 08/28/15 0800 08/28/15 1000  08/28/15 2341 08/29/15 0324 08/29/15 0800 08/30/15 0433 08/31/15 0337  WBC 12.2*  --   --   --   --   --   --   --   --  15.7*  --   --   --  20.6*  --  25.7* 24.9*  CREATININE  --   < >  --   < > 1.56*  < >  --   < >  --  1.40*  --   < > 1.50* 1.55* 1.81* 2.00* 1.63*  LATICACIDVEN  --   --  8.29*  --  3.2*  --  3.1*  --  2.2*  --  1.6  --   --   --   --   --   --   < > = values in this interval not displayed.  Estimated Creatinine Clearance: 45.1 mL/min (by C-G formula based on Cr of 1.63).    Not on File  Antimicrobials this admission: 2/15: Vanc>>  2/15: Zosyn>>  Microbiology results: 2/15 BCx x 2>>sent  2/15 UCx>>sent  2/11 MRSA PCR>>neg   Thank you for allowing pharmacy to be a part of this patient's care.  Onyinyechi Huante C. Lennox Grumbles, PharmD Pharmacy Resident  Pager: [phone] 08/31/2015 11:44 AM

## 2015-08-31 NOTE — Progress Notes (Signed)
Checked if pt went on on xarelto what copay would be    S/W KEITH @ [facility] # [phone]   XARELTO 20 MG DAILY FOR 30 DAYS   COVER- YES  CO-PAY - $ 88.00 FOR 30 DAY SUPPLY  MAIL ORDER FOR 90 DAY SUPPLY - $ 80.00  PRIOR APPROVAL - NO PHARMACY : [facility]

## 2015-08-31 NOTE — Progress Notes (Signed)
Patient's heart rate A-fib in the 110-130's.  Paged NP Ebony Hail with cardiology. NP Ebony Hail advised to give noon Lopressor dose now.

## 2015-08-31 NOTE — Progress Notes (Signed)
Patient heart rate 120-140's, called and spoke with MD Ganji.  New orders received.
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
## 2015-08-31 NOTE — Progress Notes (Signed)
Patient's heart rate still 120-130's. Called MD Ganji, cardizem drip order received.  1705 - Patient hypotensive, cardizem drip turned off and called MD Ganji.  MD requested pharmacy be called to order loading dose and scheduled digoxin.  MD aware patient's blood pressure currently 85/35, MD advised digoxin does not affect blood pressure.

## 2015-08-31 NOTE — Progress Notes (Addendum)
ANTICOAGULATION CONSULT NOTE - Follow Up Consult  Pharmacy Consult for Heparin  Indication: atrial fibrillation, s/p Vfib arrest, s/p therapeutic hypothermia   Not on File  Patient Measurements: Height: 6' (182.9 cm) Weight: 226 lb 1.7 oz (102.56 kg) IBW/kg (Calculated) : 77.6  Vital Signs: Temp: 99 F (37.2 C) (02/15 0000) Temp Source: Core (Comment) (02/15 0000) BP: 156/49 mmHg (02/14 2306) Pulse Rate: 99 (02/14 2306)  Labs:  Recent Labs  08/28/15 0800  08/28/15 1800  08/28/15 2341 08/29/15 0324 08/29/15 0800 08/30/15 0433 08/30/15 0434 08/30/15 1245 08/31/15 0100  HGB 11.5*  12.6*  < >  --   < > 12.2* 11.4*  --  10.9*  --   --   --   HCT 34.6*  37.0*  < >  --   < > 36.0* 35.3*  --  33.4*  --   --   --   PLT 336  --   --   --   --  333  --  301  --   --   --   APTT  --   < > 79*  --   --  66*  --   --  38*  --   --   HEPARINUNFRC  --   < > 0.98*  --   --  0.80*  --   --  0.18* 0.26* 0.31  CREATININE 1.40*  < >  --   < > 1.50* 1.55* 1.81* 2.00*  --   --   --   TROPONINI 0.35*  --   --   --   --   --   --   --   --   --   --   < > = values in this interval not displayed.  Estimated Creatinine Clearance: 37.1 mL/min (by C-G formula based on Cr of 2).   Assessment: Heparin level therapeutic x 1 after rate increase, s/p hypothermia with increasing heparin needs  Goal of Therapy:  Heparin level 0.3-0.7 units/ml Monitor platelets by anticoagulation protocol: Yes   Plan:  -Continue heparin at 1400 units/hr -1000 HL  Narda Bonds 08/31/2015,2:49 AM   Addendum: Confirmatory heparin level is therapeutic at 0.30 but on the low end. Will increase rate to 1500 to try and keep at goal. Follow up heparin level and CBC in AM.  Deboraha Sprang 08/30/2014, 1:06 PM

## 2015-08-31 NOTE — Progress Notes (Signed)
Patient heart rate 110-120's and patient having increased work of breathing, respiratory put patient back on ventilator support.

## 2015-08-31 NOTE — Progress Notes (Signed)
Subjective:  Intubated and sedated, opens eyes to touch but unresponsive otherwise  Objective:  Vital Signs in the last 24 hours: Temp:  [97.9 F (36.6 C)-99.1 F (37.3 C)] 98.8 F (37.1 C) (02/15 0730) Pulse Rate:  [77-110] 90 (02/15 0755) Resp:  [12-34] 30 (02/15 0755) BP: (144-168)/(49-58) 168/58 mmHg (02/15 0755) SpO2:  [100 %] 100 % (02/15 0755) FiO2 (%):  [40 %] 40 % (02/15 0755) Weight:  [100.56 kg (221 lb 11.1 oz)] 100.56 kg (221 lb 11.1 oz) (02/15 0417)  Intake/Output from previous day: 02/14 0701 - 02/15 0700 In: 2626.4 [I.V.:1066.4; NG/GT:1510; IV Piggyback:50] Out: 3225 [Urine:3225]  Physical Exam: General appearance: Patient is intubated, arouses to touch. Has been rewarmed.  Lungs: clear to auscultation bilaterally Chest wall: Chest appears normal, barrel-shaped. No trauma. Heart: Distant heart sounds, murmurs could not be made out. No gallop appreciated. Abdomen: soft, non-tender; bowel sounds normal; no masses, no organomegaly and Obese Extremities: extremities normal, atraumatic, no cyanosis or edema Pulses:Normal peripheral pulses. JVD could not be made out due to central line placement and patient being intubated.  Lab Results: BMP  Recent Labs  08/29/15 0800 08/30/15 0433 08/31/15 0337  NA 139 140 139  K 4.4 4.8 4.1  CL 109 111 108  CO2 18* 17* 19*  GLUCOSE 112* 111* 142*  BUN 36* 47* 73*  CREATININE 1.81* 2.00* 1.63*  CALCIUM 8.2* 8.1* 8.1*  GFRNONAA 34* 30* 38*  GFRAA 39* 35* 45*    CBC  Recent Labs Lab 08/18/2015 1255  08/31/15 0337  WBC 12.2*  < > 24.9*  RBC 4.00*  < > 3.64*  HGB 11.8*  < > 10.4*  HCT 37.5*  < > 33.0*  PLT 335  < > 245  MCV 93.8  < > 90.7  MCH 29.5  < > 28.6  MCHC 31.5  < > 31.5  RDW 13.2  < > 14.0  LYMPHSABS 3.7  --   --   MONOABS 0.5  --   --   EOSABS 0.2  --   --   BASOSABS 0.1  --   --   < > = values in this interval not displayed.  Cardiac Panel (last 3 results)  Recent Labs  09/04/2015 2000  08/28/15 0200 08/28/15 0800  TROPONINI 0.34* 0.44* 0.35*   Cardiac Studies:  EKG 08/28/2015:  Sinus tachycardia with first-degree AV block at a rate of 50 beats a minute, low-voltage comp access. Diffuse nonspecific ST depression with T-wave inversion noted globally, prolonged QT interval.  Scheduled Meds: . antiseptic oral rinse  7 mL Mouth Rinse 10 times per day  . aspirin  81 mg Oral Daily  . atorvastatin  80 mg Oral q1800  . chlorhexidine gluconate  15 mL Mouth Rinse BID  . feeding supplement (PRO-STAT SUGAR FREE 64)  60 mL Per Tube BID  . Influenza vac split quadrivalent PF  0.5 mL Intramuscular Tomorrow-1000  . insulin aspart  0-9 Units Subcutaneous 6 times per day  . metoprolol  5 mg Intravenous 4 times per day  . pantoprazole (PROTONIX) IV  40 mg Intravenous Q24H  . pneumococcal 23 valent vaccine  0.5 mL Intramuscular Tomorrow-1000  . sodium chloride flush  10-40 mL Intracatheter Q12H  . sodium chloride flush  3 mL Intravenous Q12H   Continuous Infusions: . sodium chloride 10 mL/hr at 08/30/15 1803  . feeding supplement (VITAL HIGH PROTEIN) 1,000 mL (08/30/15 1711)  . fentaNYL infusion INTRAVENOUS Stopped (08/30/15 0823)  . heparin 1,400 Units/hr (  08/30/15 2301)  . midazolam (VERSED) infusion Stopped (08/30/15 0750)  . norepinephrine (LEVOPHED) Adult infusion Stopped (08/30/15 0750)   PRN Meds:.sodium chloride, acetaminophen (TYLENOL) oral liquid 160 mg/5 mL, fentaNYL, fentaNYL (SUBLIMAZE) injection, sodium chloride flush, sodium chloride flush   Assessment/Plan:  1. Ischemic and nonischemic cardiomyopathy with acute systolic and diastolic heart failure, no significant leak in cardiac enzymes to suggest ischemic etiology.  2. Ventricular fibrillation arrest, probably precipitated by cardiomyopathy. 3.  LV systolic dysfunction due to CPR and need for defibrillation could also be the etiology for cardiomyopathy. 4. Coronary artery disease with a moderate sized AV groove  branch revealing ulcerated 80% stenosis, mid 3 flow hence left alone. This could have precipitated arrhythmia. 5. Chronic atrial fibrillation CHA2DS2-VASCScore: Risk Score  6 (CHF and new DM),  Yearly risk of stroke  9.8.  6. Chronic long-term anticoagulation on Xarelto, presently on IV heparin as patient may need EP evaluation and possible need for ICD once he is extubated and stable. 7. Diabetes mellitus new, cannot exclude stress-induced diabetes. 8. Acute on chronic renal failure due to C. Arrest and ischemic insult; improving 9. CT chest  Posterior left upper lobe 5.2 x 3.8 cm mass compatible with lung cancer, with adjacent peripheral left upper lobe partial collapse/ consolidation.   Recommendation: He has maintained adequate perfusion and vital signs remained stable without any recurrence of ventricular arrhythmias. This morning, opens eyes to touch. Will need EP consult once medically stable. Continue IV lopressor for now.    Rachel Bo, NP-C 08/31/2015, 8:15 AM [facility] Pager: [phone] Office: [phone]

## 2015-09-01 ENCOUNTER — Inpatient Hospital Stay (HOSPITAL_COMMUNITY): Payer: Medicare Other

## 2015-09-01 DIAGNOSIS — I509 Heart failure, unspecified: Secondary | ICD-10-CM

## 2015-09-01 DIAGNOSIS — J189 Pneumonia, unspecified organism: Secondary | ICD-10-CM

## 2015-09-01 LAB — GLUCOSE, CAPILLARY
GLUCOSE-CAPILLARY: 115 mg/dL — AB (ref 65–99)
GLUCOSE-CAPILLARY: 116 mg/dL — AB (ref 65–99)
GLUCOSE-CAPILLARY: 122 mg/dL — AB (ref 65–99)
GLUCOSE-CAPILLARY: 128 mg/dL — AB (ref 65–99)
GLUCOSE-CAPILLARY: 141 mg/dL — AB (ref 65–99)
GLUCOSE-CAPILLARY: 147 mg/dL — AB (ref 65–99)
Glucose-Capillary: 125 mg/dL — ABNORMAL HIGH (ref 65–99)

## 2015-09-01 LAB — COMPREHENSIVE METABOLIC PANEL
ALBUMIN: 2 g/dL — AB (ref 3.5–5.0)
ALK PHOS: 71 U/L (ref 38–126)
ALT: 30 U/L (ref 17–63)
ANION GAP: 15 (ref 5–15)
AST: 20 U/L (ref 15–41)
BILIRUBIN TOTAL: 0.9 mg/dL (ref 0.3–1.2)
BUN: 104 mg/dL — ABNORMAL HIGH (ref 6–20)
CALCIUM: 8.3 mg/dL — AB (ref 8.9–10.3)
CO2: 20 mmol/L — ABNORMAL LOW (ref 22–32)
CREATININE: 2.43 mg/dL — AB (ref 0.61–1.24)
Chloride: 108 mmol/L (ref 101–111)
GFR, EST AFRICAN AMERICAN: 28 mL/min — AB (ref >60)
GFR, EST NON AFRICAN AMERICAN: 24 mL/min — AB (ref >60)
Glucose, Bld: 160 mg/dL — ABNORMAL HIGH (ref 65–99)
Potassium: 3.7 mmol/L (ref 3.5–5.1)
Sodium: 143 mmol/L (ref 135–145)
TOTAL PROTEIN: 5.5 g/dL — AB (ref 6.5–8.1)

## 2015-09-01 LAB — BASIC METABOLIC PANEL
Anion gap: 13 (ref 5–15)
BUN: 98 mg/dL — ABNORMAL HIGH (ref 6–20)
CALCIUM: 8.2 mg/dL — AB (ref 8.9–10.3)
CO2: 20 mmol/L — AB (ref 22–32)
CREATININE: 2.12 mg/dL — AB (ref 0.61–1.24)
Chloride: 109 mmol/L (ref 101–111)
GFR calc non Af Amer: 28 mL/min — ABNORMAL LOW (ref >60)
GFR, EST AFRICAN AMERICAN: 32 mL/min — AB (ref >60)
Glucose, Bld: 149 mg/dL — ABNORMAL HIGH (ref 65–99)
Potassium: 3.6 mmol/L (ref 3.5–5.1)
Sodium: 142 mmol/L (ref 135–145)

## 2015-09-01 LAB — POCT I-STAT 3, ART BLOOD GAS (G3+)
Acid-base deficit: 1 mmol/L (ref 0.0–2.0)
BICARBONATE: 22.2 meq/L (ref 20.0–24.0)
O2 Saturation: 99 %
PCO2 ART: 32 mmHg — AB (ref 35.0–45.0)
PO2 ART: 130 mmHg — AB (ref 80.0–100.0)
Patient temperature: 99.9
TCO2: 23 mmol/L (ref 0–100)
pH, Arterial: 7.453 — ABNORMAL HIGH (ref 7.350–7.450)

## 2015-09-01 LAB — PHOSPHORUS: Phosphorus: 2.7 mg/dL (ref 2.5–4.6)

## 2015-09-01 LAB — MAGNESIUM: MAGNESIUM: 2 mg/dL (ref 1.7–2.4)

## 2015-09-01 LAB — HEPARIN LEVEL (UNFRACTIONATED): Heparin Unfractionated: 0.33 IU/mL (ref 0.30–0.70)

## 2015-09-01 LAB — APTT: APTT: 89 s — AB (ref 24–37)

## 2015-09-01 LAB — PROCALCITONIN: PROCALCITONIN: 3.8 ng/mL

## 2015-09-01 LAB — CBC
HCT: 28.5 % — ABNORMAL LOW (ref 39.0–52.0)
HEMOGLOBIN: 9.1 g/dL — AB (ref 13.0–17.0)
MCH: 28.2 pg (ref 26.0–34.0)
MCHC: 31.9 g/dL (ref 30.0–36.0)
MCV: 88.2 fL (ref 78.0–100.0)
PLATELETS: 237 10*3/uL (ref 150–400)
RBC: 3.23 MIL/uL — AB (ref 4.22–5.81)
RDW: 14.2 % (ref 11.5–15.5)
WBC: 22.6 10*3/uL — AB (ref 4.0–10.5)

## 2015-09-01 MED ORDER — PIPERACILLIN-TAZOBACTAM 3.375 G IVPB
3.3750 g | Freq: Three times a day (TID) | INTRAVENOUS | Status: DC
  Administered 2015-09-01 – 2015-09-04 (×8): 3.375 g via INTRAVENOUS
  Filled 2015-09-01 (×11): qty 50

## 2015-09-01 MED ORDER — DEXMEDETOMIDINE HCL IN NACL 400 MCG/100ML IV SOLN
0.4000 ug/kg/h | INTRAVENOUS | Status: DC
  Administered 2015-09-01: 0.4 ug/kg/h via INTRAVENOUS
  Administered 2015-09-01: 0.5 ug/kg/h via INTRAVENOUS
  Administered 2015-09-01: 0.4 ug/kg/h via INTRAVENOUS
  Administered 2015-09-01 – 2015-09-02 (×2): 0.6 ug/kg/h via INTRAVENOUS
  Administered 2015-09-02: 0.5 ug/kg/h via INTRAVENOUS
  Administered 2015-09-02 (×2): 0.6 ug/kg/h via INTRAVENOUS
  Administered 2015-09-02 – 2015-09-03 (×2): 0.5 ug/kg/h via INTRAVENOUS
  Administered 2015-09-03 – 2015-09-04 (×4): 0.6 ug/kg/h via INTRAVENOUS
  Administered 2015-09-04: 0.7 ug/kg/h via INTRAVENOUS
  Filled 2015-09-01: qty 50
  Filled 2015-09-01 (×2): qty 100
  Filled 2015-09-01 (×2): qty 50
  Filled 2015-09-01: qty 100
  Filled 2015-09-01 (×2): qty 50
  Filled 2015-09-01: qty 100
  Filled 2015-09-01 (×2): qty 50
  Filled 2015-09-01 (×2): qty 100
  Filled 2015-09-01 (×3): qty 50
  Filled 2015-09-01 (×2): qty 100

## 2015-09-01 MED ORDER — FUROSEMIDE 10 MG/ML IJ SOLN
40.0000 mg | Freq: Four times a day (QID) | INTRAMUSCULAR | Status: DC
  Administered 2015-09-01: 40 mg via INTRAVENOUS
  Filled 2015-09-01: qty 4

## 2015-09-01 MED ORDER — POLYETHYLENE GLYCOL 3350 17 G PO PACK
17.0000 g | PACK | Freq: Every day | ORAL | Status: DC
  Administered 2015-09-01 – 2015-09-04 (×3): 17 g via ORAL
  Filled 2015-09-01 (×3): qty 1

## 2015-09-01 MED ORDER — SODIUM CHLORIDE 0.9 % IV BOLUS (SEPSIS)
250.0000 mL | Freq: Once | INTRAVENOUS | Status: AC
Start: 2015-09-01 — End: 2015-09-01
  Administered 2015-09-01: 250 mL via INTRAVENOUS

## 2015-09-01 MED ORDER — POTASSIUM CHLORIDE 20 MEQ/15ML (10%) PO SOLN
40.0000 meq | Freq: Three times a day (TID) | ORAL | Status: AC
  Administered 2015-09-01 (×2): 40 meq
  Filled 2015-09-01 (×2): qty 30

## 2015-09-01 MED ORDER — METOLAZONE 5 MG PO TABS
5.0000 mg | ORAL_TABLET | Freq: Every day | ORAL | Status: AC
Start: 2015-09-01 — End: 2015-09-01
  Administered 2015-09-01: 5 mg via ORAL
  Filled 2015-09-01: qty 1

## 2015-09-01 MED ORDER — FUROSEMIDE 10 MG/ML IJ SOLN
10.0000 mg/h | INTRAMUSCULAR | Status: DC
Start: 2015-09-01 — End: 2015-09-02
  Administered 2015-09-01: 10 mg/h via INTRAVENOUS
  Filled 2015-09-01 (×2): qty 25

## 2015-09-01 MED ORDER — SENNOSIDES-DOCUSATE SODIUM 8.6-50 MG PO TABS
1.0000 | ORAL_TABLET | Freq: Two times a day (BID) | ORAL | Status: DC
  Administered 2015-09-01 – 2015-09-04 (×4): 1 via ORAL
  Filled 2015-09-01 (×4): qty 1

## 2015-09-01 NOTE — Progress Notes (Addendum)
Subjective:  Intubated and sedated, A. Fib rate better controlled. Patient received fentanyl for agitation and BP dropped to systolic 77 mm Hg.   Objective:  Vital Signs in the last 24 hours: Temp:  [97.3 F (36.3 C)-101.3 F (38.5 C)] 100 F (37.8 C) (02/16 0835) Pulse Rate:  [85-127] 106 (02/16 0835) Resp:  [21-30] 28 (02/16 0835) BP: (79-154)/(30-95) 154/60 mmHg (02/16 0835) SpO2:  [99 %-100 %] 100 % (02/16 0835) FiO2 (%):  [40 %] 40 % (02/16 0835) Weight:  [104.6 kg (230 lb 9.6 oz)] 104.6 kg (230 lb 9.6 oz) (02/16 0345)  Intake/Output from previous day: 02/15 0701 - 02/16 0700 In: 3032.6 [I.V.:732.6; NG/GT:1550; IV Piggyback:750] Out: 9675 [Urine:1695]  Physical Exam: General appearance: Patient is intubated, arouses to touch. Has been rewarmed.  Lungs: clear to auscultation bilaterally Chest wall: Chest appears normal, barrel-shaped. No trauma. Heart: Distant heart sounds, murmurs could not be made out. No gallop appreciated. Abdomen: mildly distended; bowel sounds normal; no masses, no organomegaly and Obese Extremities: extremities normal, atraumatic, no cyanosis or edema Pulses: Normal peripheral pulses. JVD could not be made out due to central line placement and patient being intubated.  Lab Results: BMP  Recent Labs  08/30/15 0433 08/31/15 0337 09/01/15 0335  NA 140 139 142  K 4.8 4.1 3.6  CL 111 108 109  CO2 17* 19* 20*  GLUCOSE 111* 142* 149*  BUN 47* 73* 98*  CREATININE 2.00* 1.63* 2.12*  CALCIUM 8.1* 8.1* 8.2*  GFRNONAA 30* 38* 28*  GFRAA 35* 45* 32*    CBC  Recent Labs Lab 08/30/2015 1255  09/01/15 0335  WBC 12.2*  < > 22.6*  RBC 4.00*  < > 3.23*  HGB 11.8*  < > 9.1*  HCT 37.5*  < > 28.5*  PLT 335  < > 237  MCV 93.8  < > 88.2  MCH 29.5  < > 28.2  MCHC 31.5  < > 31.9  RDW 13.2  < > 14.2  LYMPHSABS 3.7  --   --   MONOABS 0.5  --   --   EOSABS 0.2  --   --   BASOSABS 0.1  --   --   < > = values in this interval not displayed.  Cardiac  Panel (last 3 results)  Recent Labs  08/19/2015 2000 08/28/15 0200 08/28/15 0800  TROPONINI 0.34* 0.44* 0.35*   Cardiac Studies:  EKG 08/28/2015:  Sinus tachycardia with first-degree AV block at a rate of 50 beats a minute, low-voltage comp access. Diffuse nonspecific ST depression with T-wave inversion noted globally, prolonged QT interval.  Scheduled Meds: . antiseptic oral rinse  7 mL Mouth Rinse 10 times per day  . aspirin  81 mg Oral Daily  . atorvastatin  80 mg Oral q1800  . chlorhexidine gluconate  15 mL Mouth Rinse BID  . digoxin  0.125 mg Oral Daily  . feeding supplement (PRO-STAT SUGAR FREE 64)  60 mL Per Tube BID  . furosemide  40 mg Intravenous Q6H  . hydrALAZINE  10 mg Intravenous 3 times per day  . insulin aspart  0-9 Units Subcutaneous 6 times per day  . metoprolol  5 mg Intravenous Q4H  . pantoprazole sodium  40 mg Per Tube Q24H  . piperacillin-tazobactam (ZOSYN)  IV  3.375 g Intravenous 3 times per day  . sodium chloride flush  10-40 mL Intracatheter Q12H  . sodium chloride flush  3 mL Intravenous Q12H  . vancomycin  1,250 mg Intravenous  Q24H   Continuous Infusions: . sodium chloride 10 mL/hr at 09/01/15 0755  . amiodarone 30 mg/hr (09/01/15 0200)  . feeding supplement (VITAL HIGH PROTEIN) 1,000 mL (09/01/15 0840)  . heparin 1,500 Units/hr (08/31/15 1946)   PRN Meds:.sodium chloride, acetaminophen (TYLENOL) oral liquid 160 mg/5 mL, fentaNYL (SUBLIMAZE) injection, sodium chloride flush, sodium chloride flush   Assessment/Plan:  1. Ischemic and nonischemic cardiomyopathy with acute systolic and diastolic heart failure, no significant leak in cardiac enzymes to suggest ischemic etiology.  2. Ventricular fibrillation arrest, probably precipitated by cardiomyopathy. 3.  LV systolic dysfunction due to CPR and need for defibrillation could also be the etiology for cardiomyopathy.  4. Coronary artery disease with a moderate sized AV groove branch revealing ulcerated  80% stenosis, mid 3 flow hence left alone. This could have precipitated arrhythmia. 5. Chronic atrial fibrillation CHA2DS2-VASCScore: Risk Score  6 (CHF and new DM),  Yearly risk of stroke  9.8.  6. Chronic long-term anticoagulation on Xarelto, presently on IV heparin as patient may need EP evaluation and possible need for ICD once he is extubated and stable. 7. Diabetes mellitus new, cannot exclude stress-induced diabetes. 8. Acute on chronic renal failure due to C. Arrest and ischemic insult; 9. CT chest  Posterior left upper lobe 5.2 x 3.8 cm mass compatible with lung cancer, with adjacent peripheral left upper lobe partial collapse/ consolidation.   Recommendation: May need inotropic support if BP does not improve. Get echo this morning. Check CMP today. Lasix x 1 now unless BP continues to remain low. D/W RN.  Adrian Prows, MD 09/01/2015, [facility] Pager: [phone] Office: [phone]  Addendum: I met with patient's wife and also daughter, they are realistic with regards to outcomes.  The leg to continue aggressive medical therapy for now until physicians feel that care is futile.  We will continue to make updates to them regarding his critical illness.
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
## 2015-09-01 NOTE — Progress Notes (Signed)
[place] Progress Note Patient Name: SAVIEN HRDLICKA DOB: 1935-03-06 MRN: [id number]   Date of Service  09/01/2015  HPI/Events of Note  Hypotension - BP = 84/37 by A-line. EF estimated to be 15% to 25%.  eICU Interventions  Will cautiously bolus with 0.9 NaCl 250 mL IV over 30 minutes now.      Intervention Category Major Interventions: Hypotension - evaluation and management  Sommer,Steven Eugene 09/01/2015, 3:38 AM

## 2015-09-01 NOTE — Progress Notes (Signed)
  Echocardiogram 2D Echocardiogram has been performed.  Jerry Chase 09/01/2015, 3:20 PM

## 2015-09-01 NOTE — Progress Notes (Signed)
ANTICOAGULATION CONSULT NOTE - Follow Up Consult  Pharmacy Consult for Heparin  Indication: atrial fibrillation, s/p Vfib arrest, s/p therapeutic hypothermia   Not on File  Patient Measurements: Height: 6' (182.9 cm) Weight: 230 lb 9.6 oz (104.6 kg) IBW/kg (Calculated) : 77.6  Vital Signs: Temp: 100 F (37.8 C) (02/16 0900) Temp Source: Core (Comment) (02/16 0900) BP: 154/60 mmHg (02/16 0835) Pulse Rate: 93 (02/16 0900)  Labs:  Recent Labs  08/30/15 0433 08/30/15 0434  08/31/15 0100 08/31/15 0337 08/31/15 1130 09/01/15 0335  HGB 10.9*  --   --   --  10.4*  --  9.1*  HCT 33.4*  --   --   --  33.0*  --  28.5*  PLT 301  --   --   --  245  --  237  APTT  --  38*  --   --  65*  --  89*  HEPARINUNFRC  --  0.18*  < > 0.31  --  0.30 0.33  CREATININE 2.00*  --   --   --  1.63*  --  2.12*  < > = values in this interval not displayed.  Estimated Creatinine Clearance: 35.3 mL/min (by C-G formula based on Cr of 2.12).   Assessment: 81yo male admitted 09/03/2015 after cardiac arrest. On xarelto PTA for Afib. Transitioned to heparin IV.   HL therapeutic (0.33) on 1500 units/hr.  H/H stable, PLT wnl, no bleeding noted.   Goal of Therapy:  Heparin level 0.3-0.7 units/ml Monitor platelets by anticoagulation protocol: Yes   Plan:  -Continue heparin at 1500 units/hr -Daily/HL/CBC  -Monitor for s/s of bleeding   Linville Decarolis C. Lennox Grumbles, PharmD Pharmacy Resident  Pager: [phone] 09/01/2015 10:01 AM

## 2015-09-01 NOTE — Progress Notes (Signed)
ABG collected  

## 2015-09-01 NOTE — Progress Notes (Addendum)
Okay to drop set RR from 30bpm to 18bpm due to ABG results per CCM, NP. Order changed in EPIC to reflect new vent settings. RT will continue to monitor.
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
## 2015-09-02 ENCOUNTER — Inpatient Hospital Stay (HOSPITAL_COMMUNITY): Payer: Medicare Other

## 2015-09-02 LAB — BASIC METABOLIC PANEL
ANION GAP: 16 — AB (ref 5–15)
BUN: 132 mg/dL — ABNORMAL HIGH (ref 6–20)
CALCIUM: 8.3 mg/dL — AB (ref 8.9–10.3)
CO2: 22 mmol/L (ref 22–32)
Chloride: 106 mmol/L (ref 101–111)
Creatinine, Ser: 2.62 mg/dL — ABNORMAL HIGH (ref 0.61–1.24)
GFR, EST AFRICAN AMERICAN: 25 mL/min — AB (ref >60)
GFR, EST NON AFRICAN AMERICAN: 22 mL/min — AB (ref >60)
Glucose, Bld: 141 mg/dL — ABNORMAL HIGH (ref 65–99)
POTASSIUM: 3.3 mmol/L — AB (ref 3.5–5.1)
SODIUM: 144 mmol/L (ref 135–145)

## 2015-09-02 LAB — PHOSPHORUS: PHOSPHORUS: 4.7 mg/dL — AB (ref 2.5–4.6)

## 2015-09-02 LAB — GLUCOSE, CAPILLARY
GLUCOSE-CAPILLARY: 127 mg/dL — AB (ref 65–99)
GLUCOSE-CAPILLARY: 137 mg/dL — AB (ref 65–99)
GLUCOSE-CAPILLARY: 141 mg/dL — AB (ref 65–99)
Glucose-Capillary: 145 mg/dL — ABNORMAL HIGH (ref 65–99)
Glucose-Capillary: 137 mg/dL — ABNORMAL HIGH (ref 65–99)
Glucose-Capillary: 121 mg/dL — ABNORMAL HIGH (ref 65–99)

## 2015-09-02 LAB — URINE CULTURE: Culture: >=100000

## 2015-09-02 LAB — BLOOD GAS, ARTERIAL
Acid-base deficit: 2 mmol/L (ref 0.0–2.0)
BICARBONATE: 22.1 meq/L (ref 20.0–24.0)
Drawn by: 441661
FIO2: 0.4
MECHVT: 560 mL
O2 Saturation: 99 %
PATIENT TEMPERATURE: 97.5
PEEP: 5 cmH2O
PO2 ART: 142 mmHg — AB (ref 80.0–100.0)
RATE: 18 resp/min
TCO2: 23.2 mmol/L (ref 0–100)
pCO2 arterial: 35.4 mmHg (ref 35.0–45.0)
pH, Arterial: 7.408 (ref 7.350–7.450)

## 2015-09-02 LAB — CBC
HCT: 28.6 % — ABNORMAL LOW (ref 39.0–52.0)
HEMOGLOBIN: 9 g/dL — AB (ref 13.0–17.0)
MCH: 27.9 pg (ref 26.0–34.0)
MCHC: 31.5 g/dL (ref 30.0–36.0)
MCV: 88.5 fL (ref 78.0–100.0)
Platelets: 238 10*3/uL (ref 150–400)
RBC: 3.23 MIL/uL — AB (ref 4.22–5.81)
RDW: 14.3 % (ref 11.5–15.5)
WBC: 21.4 10*3/uL — AB (ref 4.0–10.5)

## 2015-09-02 LAB — HEPARIN LEVEL (UNFRACTIONATED): Heparin Unfractionated: 0.45 IU/mL (ref 0.30–0.70)

## 2015-09-02 LAB — MAGNESIUM: MAGNESIUM: 2.1 mg/dL (ref 1.7–2.4)

## 2015-09-02 LAB — APTT: APTT: 100 s — AB (ref 24–37)

## 2015-09-02 LAB — PROCALCITONIN: PROCALCITONIN: 3.73 ng/mL

## 2015-09-02 MED ORDER — POTASSIUM CHLORIDE 20 MEQ/15ML (10%) PO SOLN
40.0000 meq | Freq: Three times a day (TID) | ORAL | Status: AC
  Administered 2015-09-02 (×2): 40 meq
  Filled 2015-09-02 (×2): qty 30

## 2015-09-02 MED ORDER — METOLAZONE 5 MG PO TABS
10.0000 mg | ORAL_TABLET | Freq: Once | ORAL | Status: AC
  Administered 2015-09-02: 10 mg via ORAL
  Filled 2015-09-02: qty 2

## 2015-09-02 MED ORDER — ALBUTEROL SULFATE (2.5 MG/3ML) 0.083% IN NEBU
2.5000 mg | INHALATION_SOLUTION | RESPIRATORY_TRACT | Status: DC | PRN
  Administered 2015-09-02 – 2015-09-04 (×4): 2.5 mg via RESPIRATORY_TRACT
  Filled 2015-09-02 (×5): qty 3

## 2015-09-02 MED ORDER — FUROSEMIDE 10 MG/ML IJ SOLN: 4.0000 mg/h | INTRAVENOUS | Status: DC
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
## 2015-09-02 NOTE — Progress Notes (Signed)
[place] Progress Note Patient Name: Jerry Chase DOB: Dec 03, 1934 MRN: [id number]   Date of Service  09/02/2015  HPI/Events of Note  Nurse states that breathing is "more labored" than previously. Video assessment - he looks comfortable on the ventilator and is breathing at a rate of 24 (6 breaths over his set rate of 18). CVP = 10.   eICU Interventions  Will order: 1. Albuterol 2.5 mg via neb now and Q 2 hours PRN.     Intervention Category Intermediate Interventions: Respiratory distress - evaluation and management  Sommer,Steven Eugene 09/02/2015, 1:56 AM

## 2015-09-02 NOTE — Progress Notes (Signed)
Pt exhibiting increased RR, coarse crackles, and accessory muscle use. CVP 10. Receiving lasix gtt 18mL/hr. Output decreased over past two hours. Dr. Oletta Darter notified and viewed patient over camera. Ordered albuterol treatment prn to open airway.

## 2015-09-02 NOTE — Progress Notes (Signed)
ANTICOAGULATION CONSULT NOTE - Follow Up Consult  Pharmacy Consult for Heparin  Indication: atrial fibrillation, s/p Vfib arrest, s/p therapeutic hypothermia   Patient Measurements: Height: 6' (182.9 cm) Weight: 233 lb 0.4 oz (105.7 kg) IBW/kg (Calculated) : 77.6  Vital Signs: Temp: 97.2 F (36.2 C) (02/17 0900) Temp Source: Core (Comment) (02/17 0736) BP: 101/49 mmHg (02/17 0900) Pulse Rate: 72 (02/17 0900)  Labs:  Recent Labs  08/31/15 0337 08/31/15 1130 09/01/15 0335 09/01/15 1015 09/02/15 0530  HGB 10.4*  --  9.1*  --  9.0*  HCT 33.0*  --  28.5*  --  28.6*  PLT 245  --  237  --  238  APTT 65*  --  89*  --  100*  HEPARINUNFRC  --  0.30 0.33  --  0.45  CREATININE 1.63*  --  2.12* 2.43* 2.62*    Estimated Creatinine Clearance: 28.7 mL/min (by C-G formula based on Cr of 2.62).   Assessment: 81yo male admitted 08/30/2015 after cardiac arrest. On xarelto PTA for Afib. Transitioned to heparin IV.   HL therapeutic (0.45) on 1500 units/hr.  H/H stable, PLT wnl, no bleeding noted.    Goal of Therapy:  Heparin level 0.3-0.7 units/ml Monitor platelets by anticoagulation protocol: Yes   Plan:  -Continue heparin at 1500 units/hr -Daily HL/CBC  -Monitor for s/sx of bleeding   Rise Patience, PharmD Candidate

## 2015-09-02 NOTE — Progress Notes (Addendum)
Subjective:  Intubated and sedated, A. Fib rate better controlled. Patient received fentanyl for agitation and BP dropped to systolic 77 mm Hg.   Objective:  Vital Signs in the last 24 hours: Temp:  [96.8 F (36 C)-99.3 F (37.4 C)] 97.5 F (36.4 C) (02/17 1500) Pulse Rate:  [43-95] 75 (02/17 1500) Resp:  [20-27] 22 (02/17 1500) BP: (101-159)/(34-50) 159/50 mmHg (02/17 1219) SpO2:  [100 %] 100 % (02/17 1500) FiO2 (%):  [40 %] 40 % (02/17 1452) Weight:  [105.7 kg (233 lb 0.4 oz)] 105.7 kg (233 lb 0.4 oz) (02/17 0500)  Intake/Output from previous day: 02/16 0701 - 02/17 0700 In: 3658.3 [I.V.:1378.3; NG/GT:1880; IV Piggyback:400] Out: 2530 [Urine:2530]  Physical Exam: General appearance: Patient is intubated, arouses to touch. Has been rewarmed.  Lungs: clear to auscultation bilaterally Chest wall: Chest appears normal, barrel-shaped. No trauma. Heart: Distant heart sounds, murmurs could not be made out. No gallop appreciated. Abdomen: mildly distended; bowel sounds normal; no masses, no organomegaly and Obese Extremities: extremities normal, atraumatic, no cyanosis or edema Pulses: Normal peripheral pulses. JVD could not be made out due to central line placement and patient being intubated.  Lab Results: BMP  Recent Labs  09/01/15 0335 09/01/15 1015 09/02/15 0530  NA 142 143 144  K 3.6 3.7 3.3*  CL 109 108 106  CO2 20* 20* 22  GLUCOSE 149* 160* 141*  BUN 98* 104* 132*  CREATININE 2.12* 2.43* 2.62*  CALCIUM 8.2* 8.3* 8.3*  GFRNONAA 28* 24* 22*  GFRAA 32* 28* 25*    CBC  Recent Labs Lab 08/17/2015 1255  09/02/15 0530  WBC 12.2*  < > 21.4*  RBC 4.00*  < > 3.23*  HGB 11.8*  < > 9.0*  HCT 37.5*  < > 28.6*  PLT 335  < > 238  MCV 93.8  < > 88.5  MCH 29.5  < > 27.9  MCHC 31.5  < > 31.5  RDW 13.2  < > 14.3  LYMPHSABS 3.7  --   --   MONOABS 0.5  --   --   EOSABS 0.2  --   --   BASOSABS 0.1  --   --   < > = values in this interval not displayed.  Cardiac Panel  (last 3 results)  Recent Labs  08/26/2015 2000 08/28/15 0200 08/28/15 0800  TROPONINI 0.34* 0.44* 0.35*   Cardiac Studies:  Echocardiogram 09/01/2015: Left ventricle: The cavity size was normal. There was moderate concentric hypertrophy. Systolic function was normal. The estimated ejection fraction was 55%. - Aortic valve: Moderate diffuse thickening and calcification. There was mild stenosis. Valve area (VTI): 1.35 cm^2. Valve area(Vmax): 1.32 cm^2. Valve area (Vmean): 1.37 cm^2. - Aorta: Aortic root dimension: 38 mm (ED). The aortic root was mildly dilated. Mild pulmonary hypertension, PA pressure 39 mmHg.  EKG 08/28/2015:  Sinus tachycardia with first-degree AV block at a rate of 50 beats a minute, low-voltage comp access. Diffuse nonspecific ST depression with T-wave inversion noted globally, prolonged QT interval.  Coronary angiogram 08/23/2015: Severe LV systolic dysfunction, EF 0000000.  Global hypokinesis.  Chronically occluded RCA with collaterals from the LAD, circumflex AV groove branch 80% stenosis.  Mild disease in the LAD.  Coronary anatomy does not explain elevated dysfunction.  Scheduled Meds: . antiseptic oral rinse  7 mL Mouth Rinse 10 times per day  . aspirin  81 mg Oral Daily  . atorvastatin  80 mg Oral q1800  . chlorhexidine gluconate  15 mL Mouth Rinse  BID  . feeding supplement (PRO-STAT SUGAR FREE 64)  60 mL Per Tube BID  . hydrALAZINE  10 mg Intravenous 3 times per day  . insulin aspart  0-9 Units Subcutaneous 6 times per day  . metoprolol  5 mg Intravenous Q4H  . pantoprazole sodium  40 mg Per Tube Q24H  . piperacillin-tazobactam (ZOSYN)  IV  3.375 g Intravenous 3 times per day  . polyethylene glycol  17 g Oral Daily  . potassium chloride  40 mEq Per Tube TID  . senna-docusate  1 tablet Oral BID  . sodium chloride flush  10-40 mL Intracatheter Q12H  . sodium chloride flush  3 mL Intravenous Q12H  . vancomycin  1,250 mg Intravenous Q24H   Continuous  Infusions: . sodium chloride 10 mL/hr (09/01/15 2000)  . amiodarone 30 mg/hr (09/02/15 1200)  . dexmedetomidine 0.5 mcg/kg/hr (09/02/15 0857)  . feeding supplement (VITAL HIGH PROTEIN) 1,000 mL (09/01/15 2000)  . furosemide (LASIX) infusion 10 mg/hr (09/02/15 1130)  . heparin 1,500 Units/hr (09/02/15 0800)   PRN Meds:.sodium chloride, acetaminophen (TYLENOL) oral liquid 160 mg/5 mL, albuterol, fentaNYL (SUBLIMAZE) injection, sodium chloride flush, sodium chloride flush   Assessment/Plan:  1. Ischemic and nonischemic cardiomyopathy with acute systolic and diastolic heart failure, no significant leak in cardiac enzymes to suggest ischemic etiology. Ejection fraction is now normal by echocardiogram done yesterday. 2. Ventricular fibrillation arrest, whether unstable plaque in the small circumflex AV groove branch was the culprit needs to be determined if it precipitated arrhythmia.. 3. LV systolic dysfunction due to CPR and need for defibrillation could also be the etiology for cardiomyopathy.  4. CT chest compatible with lung cancer in the posterior left upper lobe measuring 5.0 x 2.8 cm. 5. Chronic atrial fibrillation CHA2DS2-VASCScore: Risk Score  6 (CHF and new DM),  Yearly risk of stroke  9.8.  6. Chronic long-term anticoagulation on Xarelto, presently on IV heparin as patient may need EP evaluation and possible need for ICD once he is extubated and stable. 7. Diabetes mellitus new, cannot exclude stress-induced diabetes. 8. Acute on chronic renal failure due to C. Arrest, hypertension and ischemic insult;  Recommendation: I have discontinued digoxin as his ejection fraction is improved to normal without wall motion abnormality and also the heart rate is controlled with the amiodarone.  We will await complete cessation of sedation protocol to see how he responds clinically.  From cardiac standpoint he is on appropriate therapy, continue IV heparin for now. Continue IV amiodarone until by  mouth can be tolerated.  As cardiac wise he is stable and await extubation and management of medical issues, I will see him Monday 08/27/2015 unless cardiac issue were to arise.    Adrian Prows, MD 09/02/2015, 4:05 PM [facility], [place] Pager: [phone] Mobile: [phone] Office: [phone]

## 2015-09-03 ENCOUNTER — Inpatient Hospital Stay (HOSPITAL_COMMUNITY): Payer: Medicare Other

## 2015-09-03 LAB — GLUCOSE, CAPILLARY
Glucose-Capillary: 111 mg/dL — ABNORMAL HIGH (ref 65–99)
Glucose-Capillary: 114 mg/dL — ABNORMAL HIGH (ref 65–99)
Glucose-Capillary: 119 mg/dL — ABNORMAL HIGH (ref 65–99)
Glucose-Capillary: 121 mg/dL — ABNORMAL HIGH (ref 65–99)
Glucose-Capillary: 136 mg/dL — ABNORMAL HIGH (ref 65–99)

## 2015-09-03 LAB — BLOOD GAS, ARTERIAL
ACID-BASE DEFICIT: 2.7 mmol/L — AB (ref 0.0–2.0)
Bicarbonate: 21.2 mEq/L (ref 20.0–24.0)
DRAWN BY: 44166
FIO2: 0.4
LHR: 18 {breaths}/min
MECHVT: 560 mL
O2 Saturation: 99.3 %
PATIENT TEMPERATURE: 98.6
PCO2 ART: 34.1 mmHg — AB (ref 35.0–45.0)
PEEP/CPAP: 5 cmH2O
PO2 ART: 170 mmHg — AB (ref 80.0–100.0)
TCO2: 22.2 mmol/L (ref 0–100)
pH, Arterial: 7.41 (ref 7.350–7.450)

## 2015-09-03 LAB — CBC
HEMATOCRIT: 27.5 % — AB (ref 39.0–52.0)
Hemoglobin: 8.9 g/dL — ABNORMAL LOW (ref 13.0–17.0)
MCH: 28.5 pg (ref 26.0–34.0)
MCHC: 32.4 g/dL (ref 30.0–36.0)
MCV: 88.1 fL (ref 78.0–100.0)
PLATELETS: 234 10*3/uL (ref 150–400)
RBC: 3.12 MIL/uL — ABNORMAL LOW (ref 4.22–5.81)
RDW: 14.5 % (ref 11.5–15.5)
WBC: 18.3 10*3/uL — AB (ref 4.0–10.5)

## 2015-09-03 LAB — CULTURE, RESPIRATORY

## 2015-09-03 LAB — HEPARIN LEVEL (UNFRACTIONATED): Heparin Unfractionated: 0.5 IU/mL (ref 0.30–0.70)

## 2015-09-03 LAB — BASIC METABOLIC PANEL
Anion gap: 14 (ref 5–15)
BUN: 157 mg/dL — AB (ref 6–20)
CALCIUM: 8.2 mg/dL — AB (ref 8.9–10.3)
CHLORIDE: 108 mmol/L (ref 101–111)
CO2: 23 mmol/L (ref 22–32)
CREATININE: 3.02 mg/dL — AB (ref 0.61–1.24)
GFR calc Af Amer: 21 mL/min — ABNORMAL LOW (ref >60)
GFR calc non Af Amer: 18 mL/min — ABNORMAL LOW (ref >60)
Glucose, Bld: 134 mg/dL — ABNORMAL HIGH (ref 65–99)
Potassium: 3.4 mmol/L — ABNORMAL LOW (ref 3.5–5.1)
SODIUM: 145 mmol/L (ref 135–145)

## 2015-09-03 LAB — MAGNESIUM: MAGNESIUM: 2 mg/dL (ref 1.7–2.4)

## 2015-09-03 LAB — CULTURE, RESPIRATORY W GRAM STAIN

## 2015-09-03 LAB — PHOSPHORUS: Phosphorus: 6.1 mg/dL — ABNORMAL HIGH (ref 2.5–4.6)

## 2015-09-03 MED ORDER — POTASSIUM CHLORIDE 20 MEQ/15ML (10%) PO SOLN
40.0000 meq | Freq: Three times a day (TID) | ORAL | Status: AC
  Administered 2015-09-03 (×2): 40 meq
  Filled 2015-09-03 (×2): qty 30

## 2015-09-03 NOTE — Progress Notes (Signed)
ANTICOAGULATION CONSULT NOTE - Follow Up Consult  Pharmacy Consult for Heparin  Indication: atrial fibrillation, s/p Vfib arrest, s/p therapeutic hypothermia   Patient Measurements: Height: 6' (182.9 cm) Weight: 236 lb 1.8 oz (107.1 kg) IBW/kg (Calculated) : 77.6  Vital Signs: Temp: 98.1 F (36.7 C) (02/18 0800) Temp Source: Core (Comment) (02/18 0400) BP: 99/65 mmHg (02/18 0800) Pulse Rate: 83 (02/18 0800)  Labs:  Recent Labs  09/01/15 0335 09/01/15 1015 09/02/15 0530 09/03/15 0315  HGB 9.1*  --  9.0* 8.9*  HCT 28.5*  --  28.6* 27.5*  PLT 237  --  238 234  APTT 89*  --  100*  --   HEPARINUNFRC 0.33  --  0.45 0.50  CREATININE 2.12* 2.43* 2.62* 3.02*    Estimated Creatinine Clearance: 25.1 mL/min (by C-G formula based on Cr of 3.02).   Assessment: 81yo male admitted 09/12/2015 after cardiac arrest. On xarelto PTA for Afib. Transitioned to heparin IV.   HL therapeutic (0.50) on 1500 units/hr.  H/H stable, PLT wnl, no bleeding noted.    Goal of Therapy:  Heparin level 0.3-0.7 units/ml Monitor platelets by anticoagulation protocol: Yes   Plan:  -Continue heparin at 1500 units/hr -Daily HL/CBC  -Monitor for s/sx of bleeding   Joya San, PharmD Clinical Pharmacy Resident Pager # [phone] 09/03/2015 8:47 AM

## 2015-09-03 NOTE — Progress Notes (Signed)
abg collected  

## 2015-09-03 NOTE — Progress Notes (Signed)
UR Completed. Mildred Tuccillo, RN, BSN.  [phone] 
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
## 2015-09-03 NOTE — Progress Notes (Signed)
eLink Physician-Brief Progress Note Patient Name: Jerry Chase DOB: 1935-03-19 MRN: [id number]   Date of Service  09/03/2015  HPI/Events of Note  Hypokalemia in the setting of renal insufficiency  eICU Interventions  Potassium replaced     Intervention Category Minor Interventions: Electrolytes abnormality - evaluation and management  DETERDING,ELIZABETH 09/03/2015, 4:15 AM

## 2015-09-03 NOTE — Progress Notes (Signed)
Pharmacy Antibiotic Note  Jerry Chase is a 81 y.o. male admitted on 08/22/2015 with STEMI. Pharmacy has been consulted for Vancomycin and Zosyn dosing for UTI and possible bacteremia.WBC improving, SCr trend up.  Plan:  Zosyn 3.375g IV Q8h (4 hr inf) Vancomycin 1250mg  IV Q12h  F/U c/s, renal fxn, LOT VT tomorrow if vanc continues  Height: 6' (182.9 cm) Weight: 236 lb 1.8 oz (107.1 kg) IBW/kg (Calculated) : 77.6  Temp (24hrs), Avg:97.9 F (36.6 C), Min:97.3 F (36.3 C), Max:98.4 F (36.9 C)   Recent Labs Lab 09/03/2015 1356  09/09/2015 1800  08/22/2015 2120  08/28/15 0655  08/28/15 1000  08/30/15 0433 08/31/15 0337 09/01/15 0335 09/01/15 1015 09/02/15 0530 09/03/15 0315  WBC  --   --   --   --   --   --   --   < >  --   < > 25.7* 24.9* 22.6*  --  21.4* 18.3*  CREATININE  --   < > 1.56*  < >  --   < >  --   < >  --   < > 2.00* 1.63* 2.12* 2.43* 2.62* 3.02*  LATICACIDVEN 8.29*  --  3.2*  --  3.1*  --  2.2*  --  1.6  --   --   --   --   --   --   --   < > = values in this interval not displayed.  Estimated Creatinine Clearance: 25.1 mL/min (by C-G formula based on Cr of 3.02).    Not on File  Antimicrobials this admission: 2/15: Vanc>>  2/15: Zosyn>>  Microbiology results: 2/15 BCx x 2>> 1/2 GPC in pairs 2/15 UCx>> pseudomonas (pan sensitive) 2/11 MRSA PCR>>neg   Thank you for allowing pharmacy to be a part of this patient's care.  Joya San, PharmD Clinical Pharmacy Resident Pager # [phone] 09/03/2015 10:19 AM

## 2015-09-04 ENCOUNTER — Inpatient Hospital Stay (HOSPITAL_COMMUNITY): Payer: Medicare Other

## 2015-09-04 DIAGNOSIS — N179 Acute kidney failure, unspecified: Secondary | ICD-10-CM

## 2015-09-04 LAB — CBC
HCT: 26.7 % — ABNORMAL LOW (ref 39.0–52.0)
HEMOGLOBIN: 8.4 g/dL — AB (ref 13.0–17.0)
MCH: 27.8 pg (ref 26.0–34.0)
MCHC: 31.5 g/dL (ref 30.0–36.0)
MCV: 88.4 fL (ref 78.0–100.0)
PLATELETS: 232 10*3/uL (ref 150–400)
RBC: 3.02 MIL/uL — AB (ref 4.22–5.81)
RDW: 14.7 % (ref 11.5–15.5)
WBC: 15.7 10*3/uL — AB (ref 4.0–10.5)

## 2015-09-04 LAB — BASIC METABOLIC PANEL
ANION GAP: 17 — AB (ref 5–15)
BUN: 180 mg/dL — ABNORMAL HIGH (ref 6–20)
CALCIUM: 8.2 mg/dL — AB (ref 8.9–10.3)
CO2: 23 mmol/L (ref 22–32)
Chloride: 109 mmol/L (ref 101–111)
Creatinine, Ser: 3.65 mg/dL — ABNORMAL HIGH (ref 0.61–1.24)
GFR, EST AFRICAN AMERICAN: 17 mL/min — AB (ref >60)
GFR, EST NON AFRICAN AMERICAN: 15 mL/min — AB (ref >60)
Glucose, Bld: 152 mg/dL — ABNORMAL HIGH (ref 65–99)
POTASSIUM: 3.6 mmol/L (ref 3.5–5.1)
SODIUM: 149 mmol/L — AB (ref 135–145)

## 2015-09-04 LAB — CULTURE, BLOOD (ROUTINE X 2)

## 2015-09-04 LAB — GLUCOSE, CAPILLARY
GLUCOSE-CAPILLARY: 124 mg/dL — AB (ref 65–99)
GLUCOSE-CAPILLARY: 142 mg/dL — AB (ref 65–99)
Glucose-Capillary: 142 mg/dL — ABNORMAL HIGH (ref 65–99)
Glucose-Capillary: 122 mg/dL — ABNORMAL HIGH (ref 65–99)
Glucose-Capillary: 132 mg/dL — ABNORMAL HIGH (ref 65–99)

## 2015-09-04 LAB — HEPARIN LEVEL (UNFRACTIONATED): HEPARIN UNFRACTIONATED: 0.67 [IU]/mL (ref 0.30–0.70)

## 2015-09-04 MED ORDER — MORPHINE BOLUS VIA INFUSION
2.0000 mg | INTRAVENOUS | Status: DC | PRN
  Filled 2015-09-04: qty 4

## 2015-09-04 MED ORDER — POTASSIUM CHLORIDE 20 MEQ/15ML (10%) PO SOLN
20.0000 meq | Freq: Once | ORAL | Status: AC
  Administered 2015-09-04: 20 meq
  Filled 2015-09-04: qty 15

## 2015-09-04 MED ORDER — MIDAZOLAM HCL 2 MG/2ML IJ SOLN
INTRAMUSCULAR | Status: AC
  Administered 2015-09-04: 2 mg
  Filled 2015-09-04: qty 2

## 2015-09-04 MED ORDER — LEVOFLOXACIN IN D5W 750 MG/150ML IV SOLN
750.0000 mg | INTRAVENOUS | Status: DC
  Administered 2015-09-04: 750 mg via INTRAVENOUS
  Filled 2015-09-04: qty 150

## 2015-09-04 MED ORDER — MIDAZOLAM HCL 2 MG/2ML IJ SOLN: 2.0000 mg | Freq: Once | INTRAMUSCULAR | Status: DC

## 2015-09-04 MED ORDER — MORPHINE SULFATE 25 MG/ML IV SOLN
0.0000 mg/h | INTRAVENOUS | Status: DC
  Administered 2015-09-04: 10 mg/h via INTRAVENOUS
  Administered 2015-09-05: 20 mg/h via INTRAVENOUS
  Filled 2015-09-04 (×2): qty 10

## 2015-09-04 NOTE — Progress Notes (Signed)
eLink Physician-Brief Progress Note Patient Name: [name] DOB: 1934-10-10 MRN: [id number]   Date of Service  09/04/2015  HPI/Events of Note  Comfort measures. Patient is wide awake on Morphine IV infusion at 10 mg/hour.   eICU Interventions  Will increase ceiling on Morphine IV infusion to 20 mg/hour.      Intervention Category Minor Interventions: Agitation / anxiety - evaluation and management  Terel Bann [place] 09/04/2015, 5:08 PM

## 2015-09-04 NOTE — Progress Notes (Signed)
Pharmacy Antibiotic Note  Jerry Chase is a 81 y.o. male admitted on 09/03/2015 with STEMI. Pharmacy has been consulted for levofloxacin for strep pneumo pneumonia and pseudomonas UTI.  Plan:  Stop Zosyn Levofloxacin 750 mg IV q48h for renal adjustment Follow renal function closely for renal adjustment Monitor QTc w/ concurrent use of levofloxacin and amiodarone Recommend 7 days total anitbiotic therapy (stop date 2/21)  Height: 6' (182.9 cm) Weight: 233 lb 7.5 oz (105.9 kg) IBW/kg (Calculated) : 77.6  Temp (24hrs), Avg:99.3 F (37.4 C), Min:98.4 F (36.9 C), Max:100 F (37.8 C)   Recent Labs Lab 08/31/15 0337 09/01/15 0335 09/01/15 1015 09/02/15 0530 09/03/15 0315 09/04/15 0416  WBC 24.9* 22.6*  --  21.4* 18.3* 15.7*  CREATININE 1.63* 2.12* 2.43* 2.62* 3.02* 3.65*    Estimated Creatinine Clearance: 20.6 mL/min (by C-G formula based on Cr of 3.65).    Not on File  Antimicrobials this admission: 2/15: Vanc>> 2/19 2/15: Zosyn>>2/19 Levofloxacin 2/19>>  Microbiology results: 2/15 Resp cx: strep pneumo 2/15 BCx x 2>> 1/2 GPC in pairs 2/15 UCx>> pseudomonas (pan sensitive) 2/11 MRSA PCR>>neg   Thank you for allowing pharmacy to be a part of this patient's care.  Joya San, PharmD Clinical Pharmacy Resident Pager # [phone] 09/04/2015 2:51 PM

## 2015-09-04 NOTE — Procedures (Signed)
Extubation Procedure Note  Patient Details:   Name: Jerry Chase DOB: 1934/11/12 MRN: [id number]   Airway Documentation:  Airway 8 mm (Active)  Secured at (cm) 27 cm 09/04/2015 12:41 PM  Measured From Lips 09/04/2015 12:41 PM  Secured Location Left 09/04/2015 12:41 PM  Secured By [employer] 09/04/2015 12:41 PM  Tube Holder Repositioned Yes 09/04/2015 12:41 PM  Cuff Pressure (cm H2O) 24 cm H2O 09/04/2015  8:24 AM  Site Condition Dry 09/04/2015  8:24 AM   Placed on 4lpm [place] for comfort. Evaluation  O2 sats: comfort care Complications: No apparent complications Patient did tolerate procedure well. Bilateral Breath Sounds: Rhonchi Suctioning: Airway Yes  Jerry Chase Haste 09/04/2015, 4:05 PM
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
## 2015-09-04 NOTE — Progress Notes (Signed)
ANTICOAGULATION CONSULT NOTE - Follow Up Consult  Pharmacy Consult for Heparin  Indication: atrial fibrillation, s/p Vfib arrest, s/p therapeutic hypothermia   Patient Measurements: Height: 6' (182.9 cm) Weight: 233 lb 7.5 oz (105.9 kg) IBW/kg (Calculated) : 77.6  Vital Signs: Temp: 99.7 F (37.6 C) (02/19 0600) Temp Source: Core (Comment) (02/19 0400) BP: 108/61 mmHg (02/19 0400) Pulse Rate: 70 (02/19 0600)  Labs:  Recent Labs  09/02/15 0530 09/03/15 0315 09/04/15 0416  HGB 9.0* 8.9* 8.4*  HCT 28.6* 27.5* 26.7*  PLT 238 234 232  APTT 100*  --   --   HEPARINUNFRC 0.45 0.50 0.67  CREATININE 2.62* 3.02* 3.65*    Estimated Creatinine Clearance: 20.6 mL/min (by C-G formula based on Cr of 3.65).   Assessment: 81yo male admitted 09/12/2015 after cardiac arrest. On xarelto PTA for Afib. Transitioned to heparin IV.   HL therapeutic on 1500 units/hr.  H/H stable, PLT wnl, no bleeding noted.    Goal of Therapy:  Heparin level 0.3-0.7 units/ml Monitor platelets by anticoagulation protocol: Yes   Plan:  -Continue heparin at 1500 units/hr -Daily HL/CBC  -Monitor for s/sx of bleeding   Joya San, PharmD Clinical Pharmacy Resident Pager # [phone] 09/04/2015 8:48 AM

## 2015-09-05 ENCOUNTER — Encounter (HOSPITAL_COMMUNITY): Payer: Self-pay | Admitting: Urology

## 2015-09-05 DIAGNOSIS — J9600 Acute respiratory failure, unspecified whether with hypoxia or hypercapnia: Secondary | ICD-10-CM

## 2015-09-05 DIAGNOSIS — Z515 Encounter for palliative care: Secondary | ICD-10-CM

## 2015-09-05 LAB — CULTURE, BLOOD (ROUTINE X 2): CULTURE: NO GROWTH

## 2015-09-05 MED ORDER — ACETAMINOPHEN 650 MG RE SUPP: 650.0000 mg | RECTAL | Status: DC | PRN

## 2015-09-05 NOTE — Care Management Important Message (Signed)
Important Message  Patient Details  Name: Jerry Chase MRN: [id number] Date of Birth: [birth year]   Medicare Important Message Given:  Yes    [name] 08/25/2015, 10:51 AM

## 2015-09-05 NOTE — Progress Notes (Signed)
200 mL morphine drip (1 mg per 1 mL) wasted in sink followed by water flush by 2 RN, Joseph Berkshire RN, and Newman Nickels RN
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
## 2015-09-05 NOTE — Progress Notes (Signed)
   09/03/2015 0949  Clinical Encounter Type  Visited With Health care provider;Patient and family together  Visit Type Initial;Patient actively dying  Referral From Physician   Chaplain responded to an end of life consult and met with patient's daughter and sister-in-law. Family seems well supported from their family and community, and they expressed no needs at this time. Chaplain support available as needed.   Jeri Lager, Chaplain 09/06/2015 9:50 AM

## 2015-09-05 NOTE — Progress Notes (Signed)
No pulse or respiratory effort noted, no reflexes noted. Family at bedside. Emotional support given. 2 RN auscultated for 2 minutes, no heart tones or breath sounds audible. Time of death 54.

## 2015-09-12 ENCOUNTER — Telehealth: Payer: Self-pay

## 2015-09-12 NOTE — Telephone Encounter (Signed)
On 09/12/2015 I received a death certificate from [place]. (original). The death certificate is for burial. The patient is a patient of Doctor Ramaswamy. The death certificate will be taken to Pulmonary Unit tomorrow am for signature. On 09-20-2015 I received the death certificate back from Doctor Ramaswamy. I got the death certificate ready and called the funeral home to let them know the death certificate is ready for pickup.
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
## 2015-09-14 DEATH — deceased

## 2015-10-24 IMAGING — CR DG CHEST 2V
2 series · 2 of 2 positions shown · non-contrast
Comparison: None.

CLINICAL DATA: Pre op for left knee replacement

EXAM:
CHEST  2 VIEW

[w chest pa]
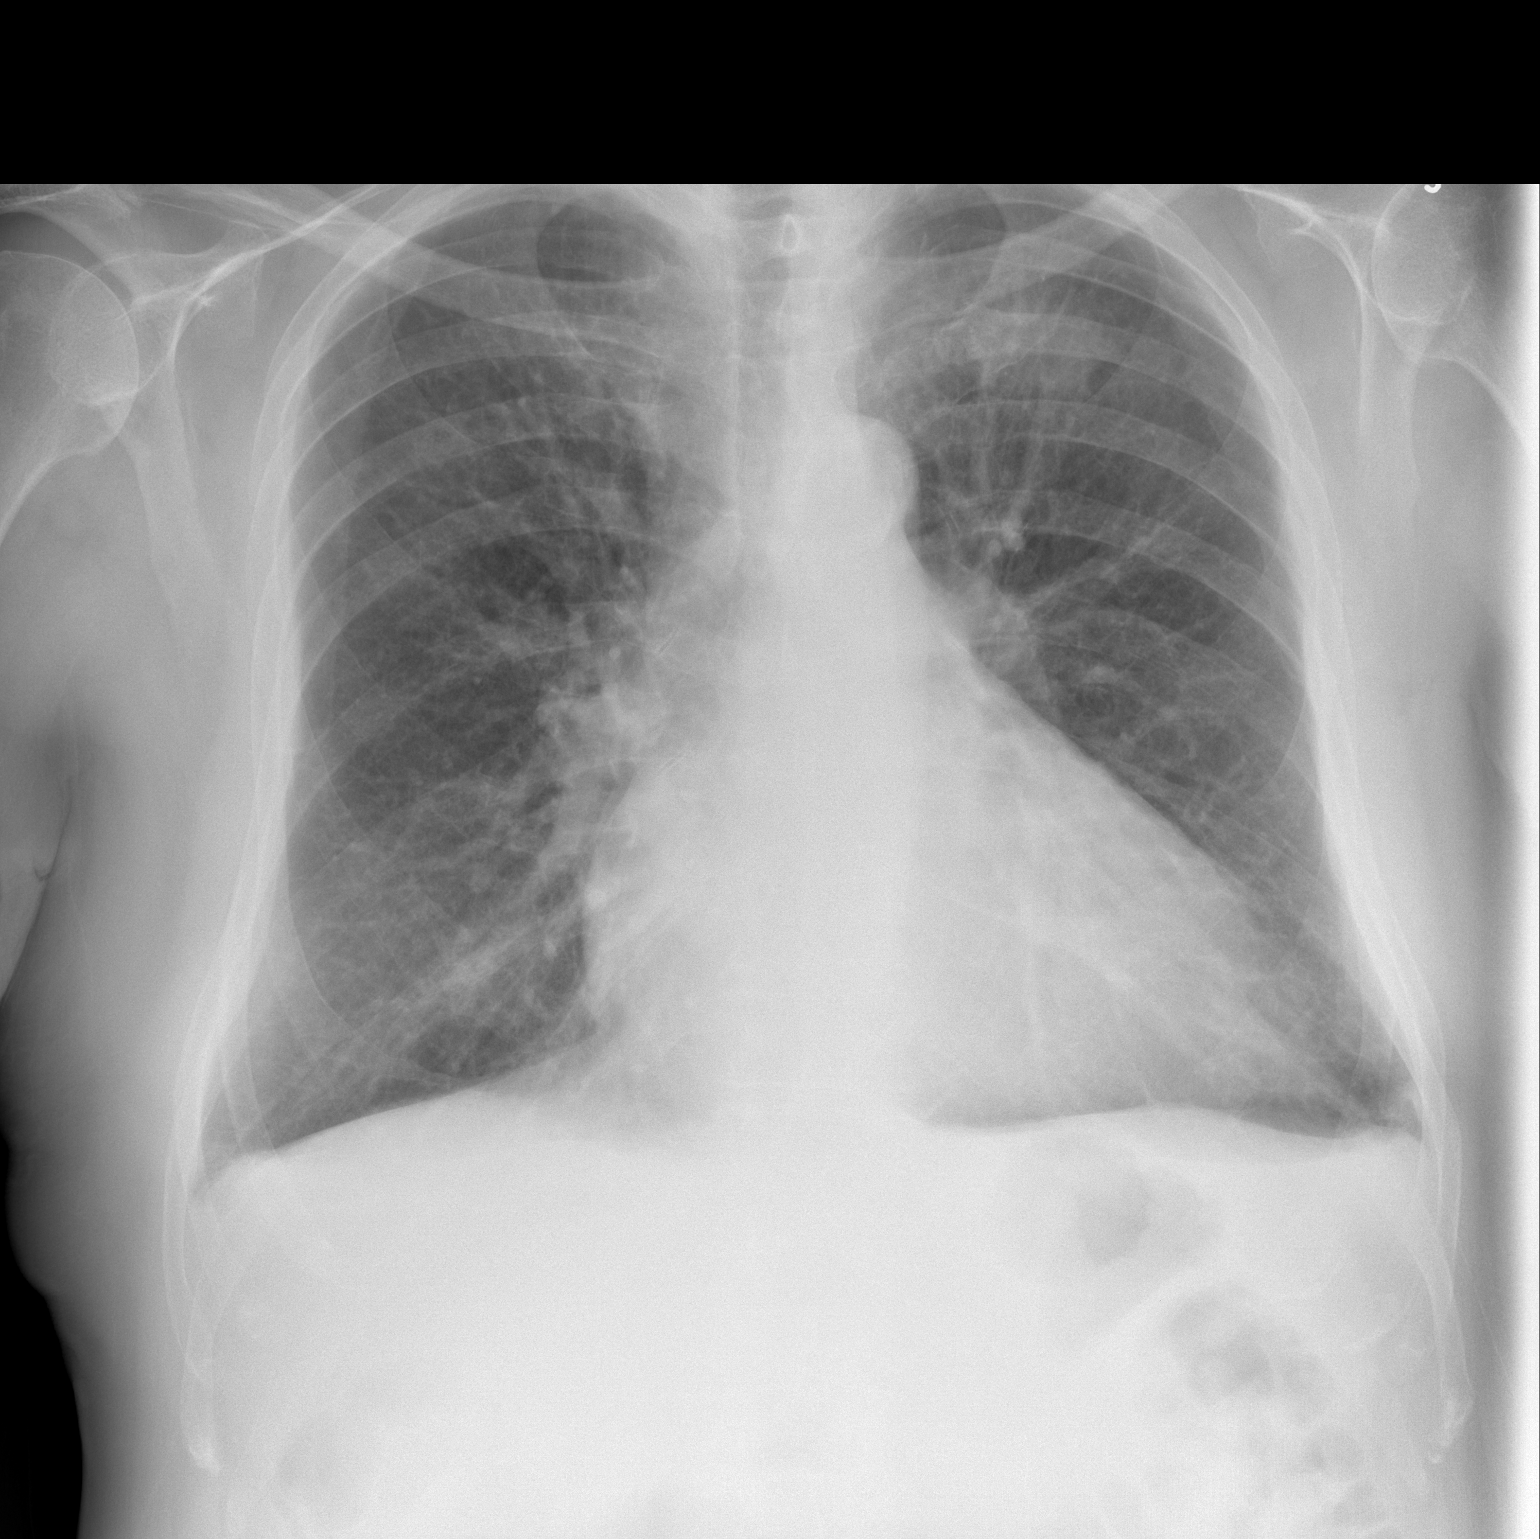

[w chest lat]
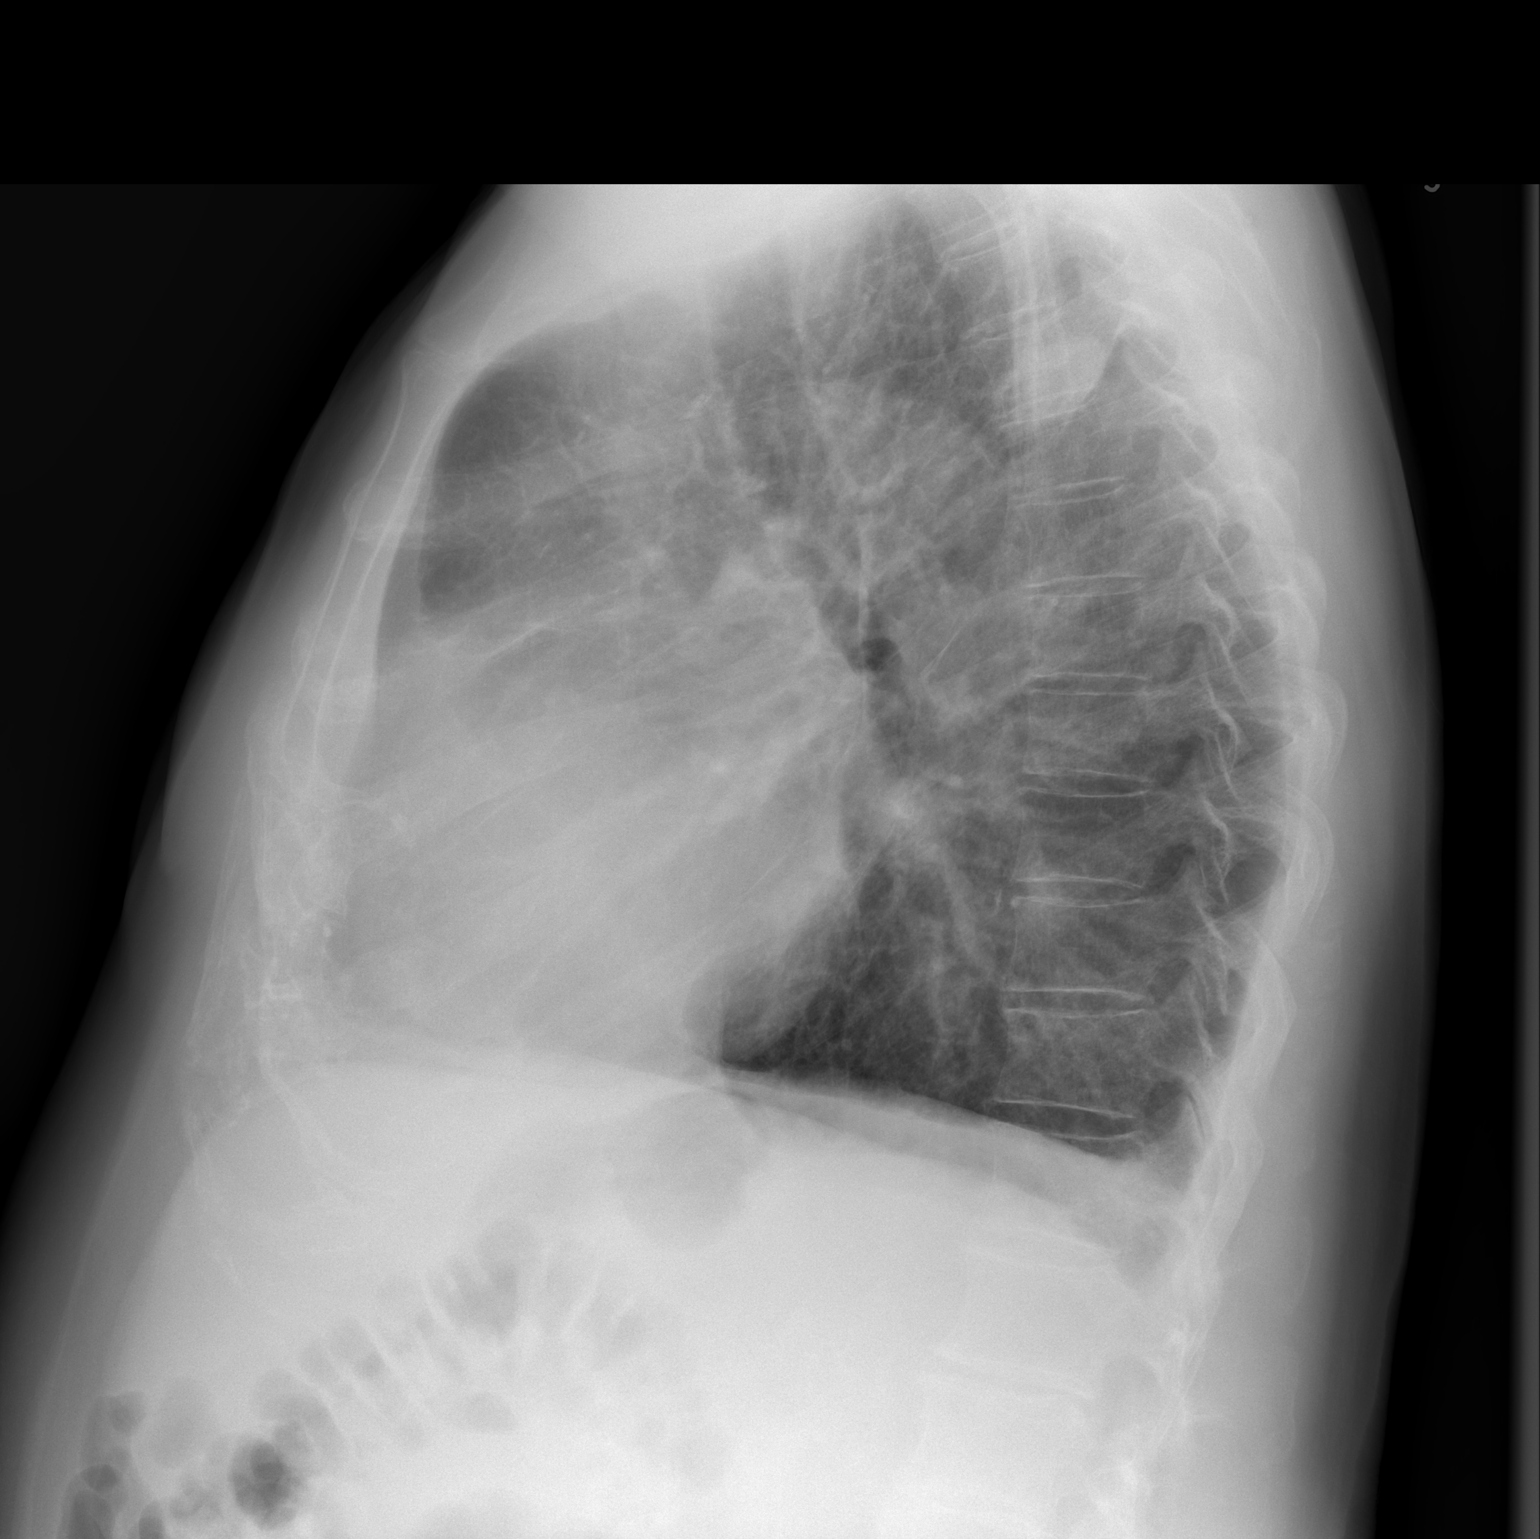

[2 of 2 positions shown; findings below may reference images not displayed]

FINDINGS: Borderline cardiomegaly. No infiltrate or pleural effusion. No
pulmonary edema. Minimal degenerative changes visualized upper
lumbar spine.
IMPRESSION: No active cardiopulmonary disease.

## 2017-11-02 IMAGING — CR DG CHEST 1V PORT
1 series · 1 of 1 positions shown · non-contrast
Comparison: Yesterday

CLINICAL DATA: Respiratory difficulty

EXAM:
PORTABLE CHEST 1 VIEW

[AP]
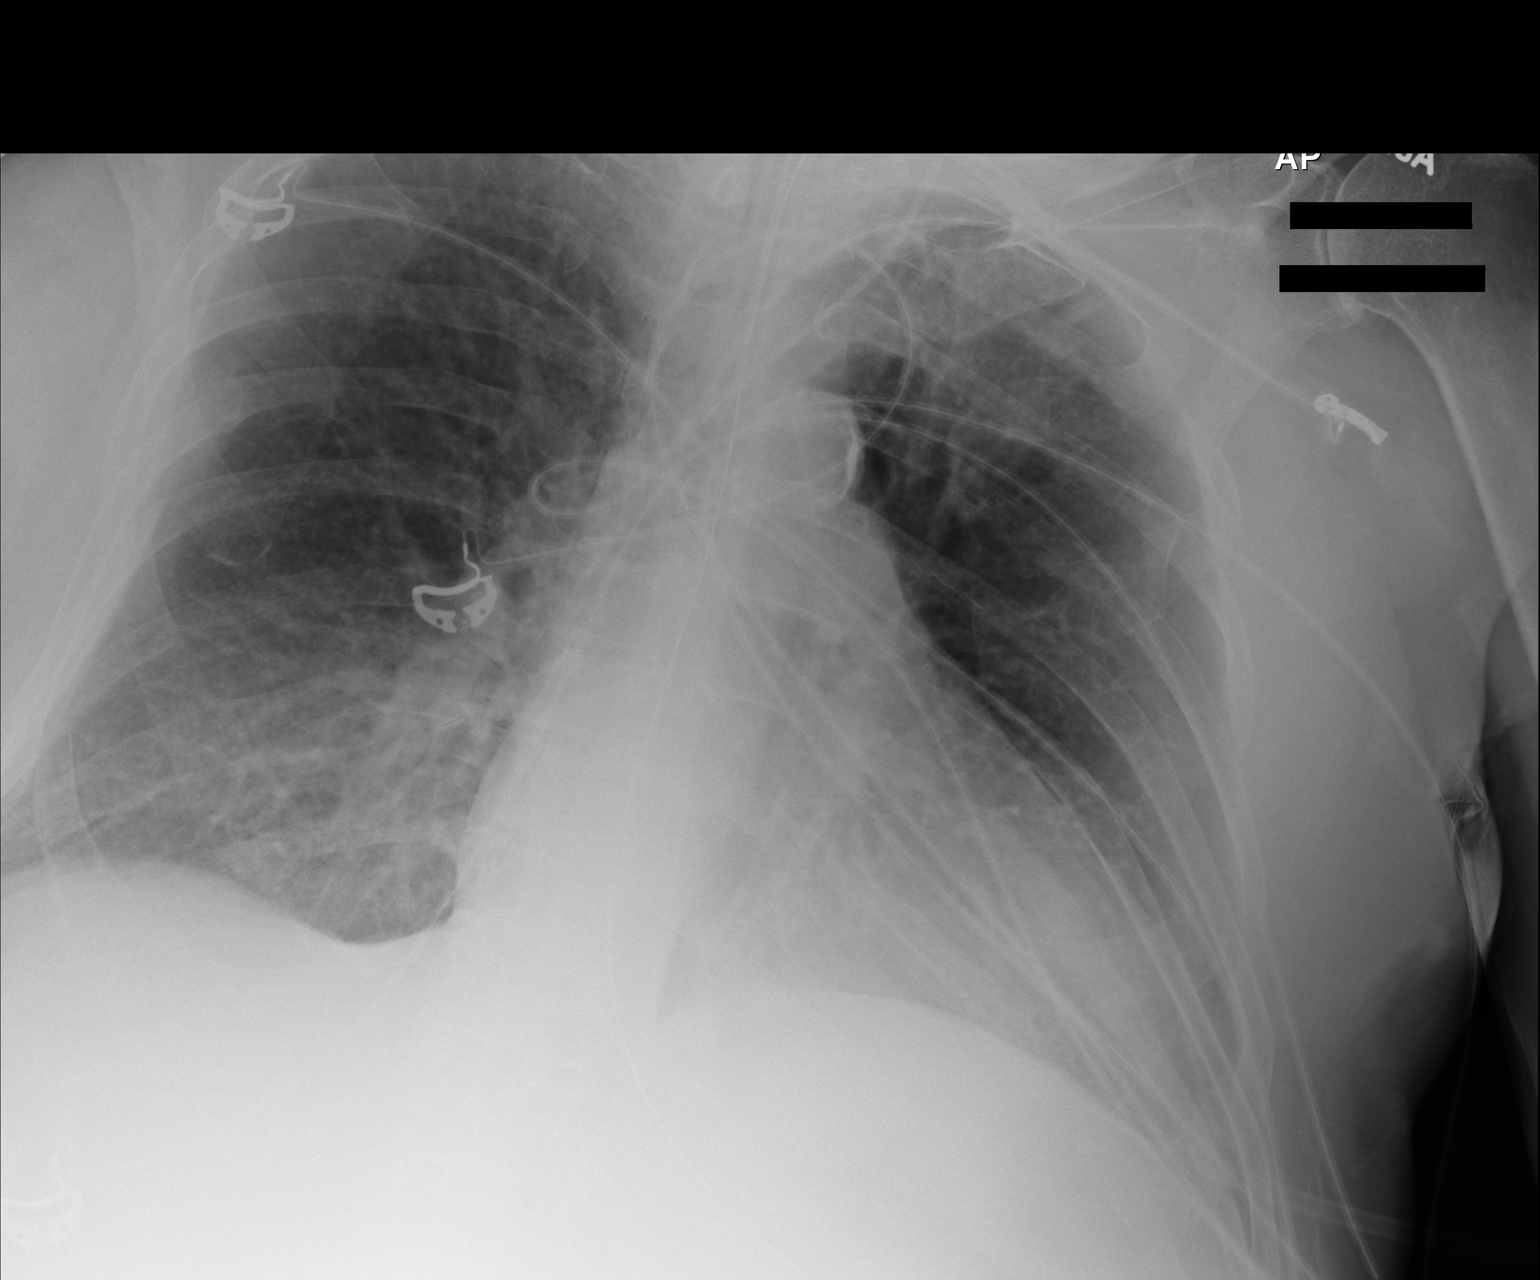

[1 of 1 positions shown; findings below may reference images not displayed]

FINDINGS: Tubular device is stable. Vascular congestion improved. Minimal hazy
airspace disease at the right base persists. Normal heart size. No
pneumothorax.
IMPRESSION: Improved vascular congestion. Stable hazy airspace disease at the
right base.

## 2017-11-03 IMAGING — CR DG CHEST 1V PORT
1 series · 1 of 1 positions shown · non-contrast
Comparison: [DATE]

CLINICAL DATA: Acute respiratory failure. Intubated patient.
Subsequent encounter.

EXAM:
PORTABLE CHEST 1 VIEW

[AP]
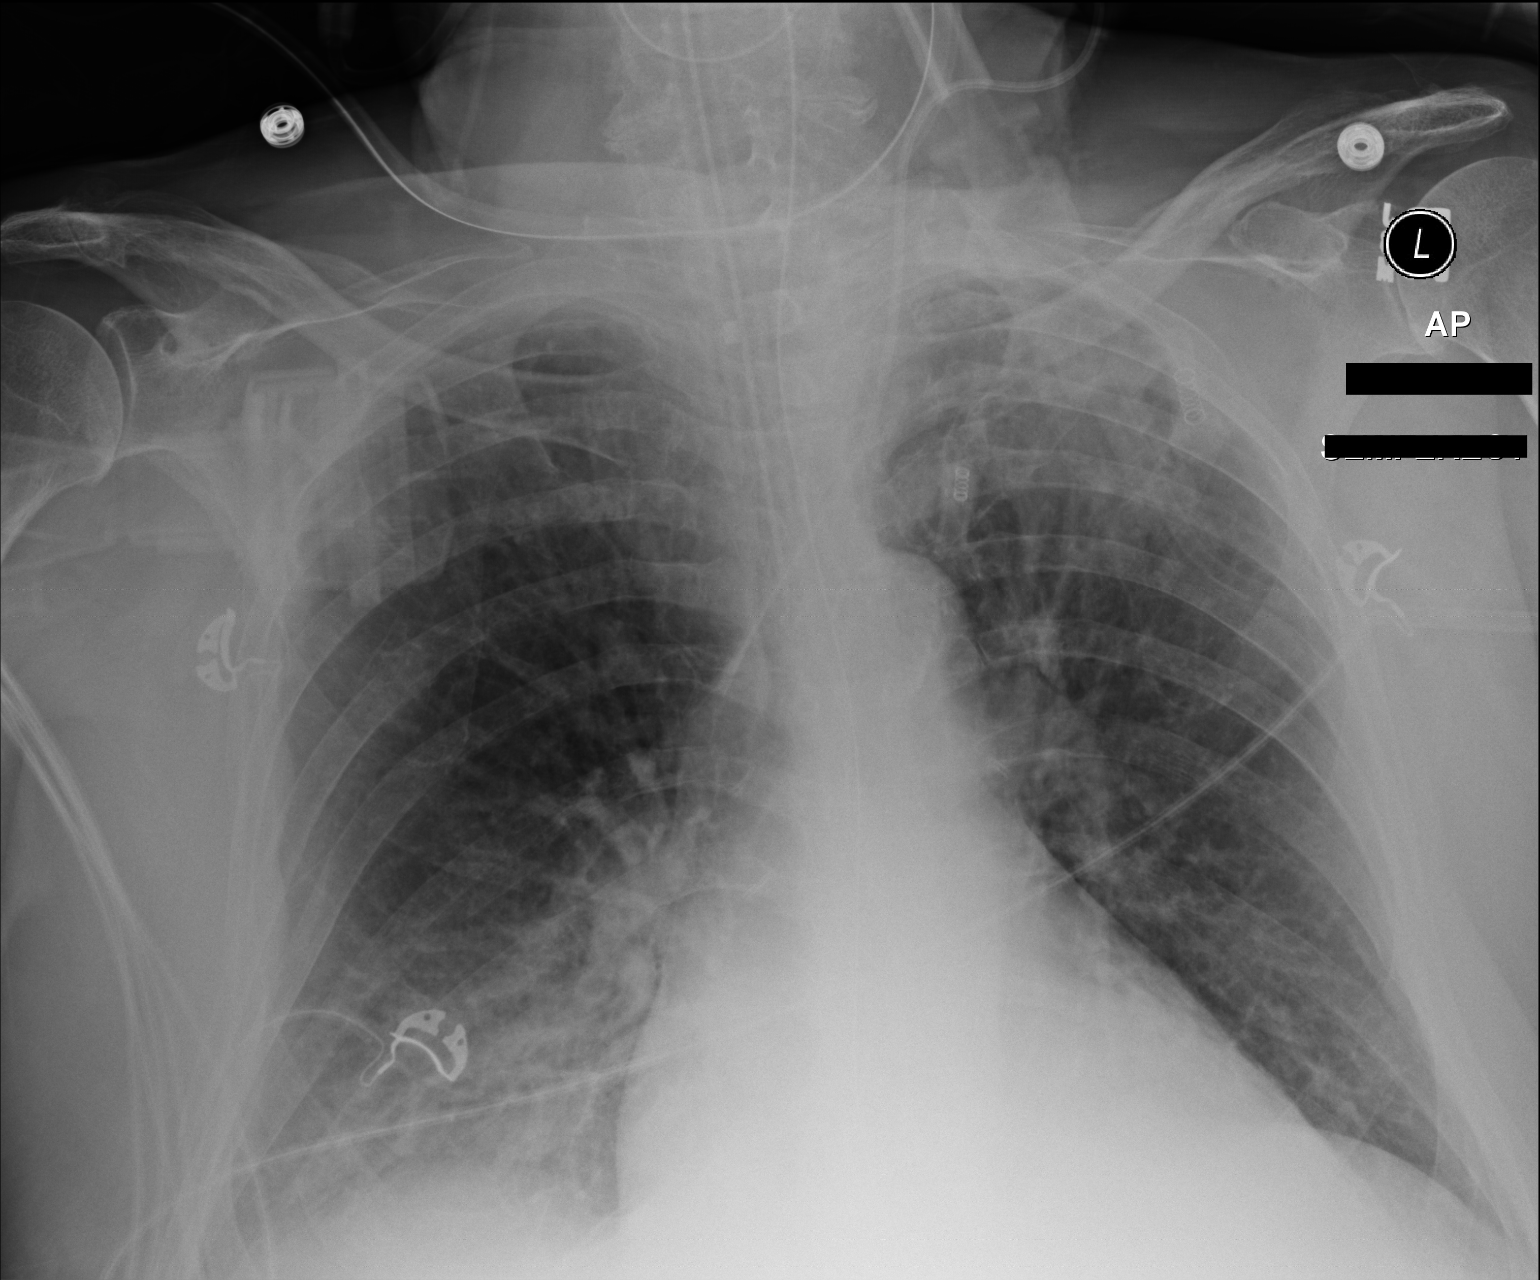

[1 of 1 positions shown; findings below may reference images not displayed]

FINDINGS: Endotracheal tube, left internal jugular central venous line and
nasal/ orogastric tube are stable and well positioned.

Hazy lung opacity at the right lung base is similar to the prior
exam. Persistent left upper lobe opacity, also stable. No areas of
new lung opacity. No overt pulmonary edema.
IMPRESSION: 1. No change from the previous day's exam.
2. Persistent opacity at the right lung base may reflect pneumonia,
atelectasis or a combination.
3. No convincing pulmonary edema.
4. Stable left upper lobe opacity reflecting the known left upper
lobe mass.
